# Patient Record
Sex: Male | Born: 1985 | Race: White | Hispanic: No | Marital: Married | Smoking: Current every day smoker
Health system: Southern US, Community
[De-identification: ages and names within clinical notes are randomized; demographics above are authoritative.]

## PROBLEM LIST (undated history)

## (undated) DIAGNOSIS — R51 Headache: Secondary | ICD-10-CM

## (undated) DIAGNOSIS — K219 Gastro-esophageal reflux disease without esophagitis: Secondary | ICD-10-CM

## (undated) DIAGNOSIS — K760 Fatty (change of) liver, not elsewhere classified: Secondary | ICD-10-CM

## (undated) DIAGNOSIS — R519 Headache, unspecified: Secondary | ICD-10-CM

## (undated) DIAGNOSIS — E669 Obesity, unspecified: Secondary | ICD-10-CM

## (undated) HISTORY — PX: WISDOM TOOTH EXTRACTION: SHX21

## (undated) HISTORY — DX: Headache: R51

## (undated) HISTORY — DX: Gastro-esophageal reflux disease without esophagitis: K21.9

## (undated) HISTORY — DX: Headache, unspecified: R51.9

## (undated) HISTORY — DX: Fatty (change of) liver, not elsewhere classified: K76.0

## (undated) HISTORY — DX: Obesity, unspecified: E66.9

---
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
## 2007-09-11 ENCOUNTER — Emergency Department (HOSPITAL_COMMUNITY): Admission: EM | Admit: 2007-09-11 | Discharge: 2007-09-11 | Payer: Self-pay | Admitting: Emergency Medicine

## 2010-04-29 ENCOUNTER — Emergency Department (HOSPITAL_COMMUNITY)
Admission: EM | Admit: 2010-04-29 | Discharge: 2010-04-29 | Payer: Self-pay | Source: Home / Self Care | Admitting: Family Medicine

## 2011-03-10 LAB — POCT I-STAT, CHEM 8
Chloride: 103
Creatinine, Ser: 1
HCT: 48
Hemoglobin: 16.3
Potassium: 4.2
Sodium: 138

## 2011-03-10 LAB — POCT CARDIAC MARKERS: Myoglobin, poc: 33.2

## 2013-06-05 ENCOUNTER — Encounter (HOSPITAL_COMMUNITY): Payer: Self-pay | Admitting: Emergency Medicine

## 2013-06-05 ENCOUNTER — Emergency Department (HOSPITAL_COMMUNITY)
Admission: EM | Admit: 2013-06-05 | Discharge: 2013-06-05 | Disposition: A | Discharge status: Home / Self Care | Payer: Self-pay | Attending: Emergency Medicine | Admitting: Emergency Medicine

## 2013-06-05 ENCOUNTER — Emergency Department (HOSPITAL_COMMUNITY): Payer: Self-pay

## 2013-06-05 DIAGNOSIS — R51 Headache: Secondary | ICD-10-CM | POA: Insufficient documentation

## 2013-06-05 DIAGNOSIS — R209 Unspecified disturbances of skin sensation: Secondary | ICD-10-CM | POA: Insufficient documentation

## 2013-06-05 DIAGNOSIS — Z87891 Personal history of nicotine dependence: Secondary | ICD-10-CM | POA: Insufficient documentation

## 2013-06-05 DIAGNOSIS — Z792 Long term (current) use of antibiotics: Secondary | ICD-10-CM | POA: Insufficient documentation

## 2013-06-05 DIAGNOSIS — H538 Other visual disturbances: Secondary | ICD-10-CM | POA: Insufficient documentation

## 2013-06-05 DIAGNOSIS — Z79899 Other long term (current) drug therapy: Secondary | ICD-10-CM | POA: Insufficient documentation

## 2013-06-05 DIAGNOSIS — R42 Dizziness and giddiness: Secondary | ICD-10-CM | POA: Insufficient documentation

## 2013-06-05 MED ORDER — FLUORESCEIN SODIUM 1 MG OP STRP: 1.0000 | ORAL_STRIP | Freq: Once | OPHTHALMIC | Status: DC

## 2013-06-05 NOTE — ED Notes (Signed)
Pa  at bedside. 

## 2013-06-05 NOTE — ED Notes (Signed)
Pt. reports left temporal headache with dizziness for several days , denies head injury , seen at an Urgent Care yesterday diagnosed with " tension headache" prescribed with muscle relaxant / pain medication and antibiotic for tooth infection .

## 2013-06-05 NOTE — ED Provider Notes (Signed)
CSN: [id number]     Arrival date & time 06/05/13  1937 History   First MD Initiated Contact with Patient 06/05/13 2008     Chief Complaint  Patient presents with  . Headache  . Dizziness   (Consider location/radiation/quality/duration/timing/severity/associated sxs/prior Treatment) HPI History provided by pt.   Pt presents w/ headache x 1 weeks.  Intermittent, mild, migratory.  Started as numbness/tingling sensation at L temple that eventually graduated into a headache.  Pain improves with rest but then increases w/ standing and exertion and it feels as though his blood vessels are going to bust.  The most recent pain has been located posterior to right ear.  Associated w/ dizziness, blurred vision and today, numbness in hands.  He is currently asymptomatic w/ exception of mild tenderness behind right ear.  Denies fever, inner ear pain, nasal congestion, rhinorrhea, sore throat, extremity weakness, ataxia, confusion.  No prior h/o headaches.  No recent trauma.  Was evaluated at an urgent care for same yesterday, diagnosed w/ tension headache and prescribed flexeril and toradol, which have not given him any relief.  History reviewed. No pertinent past medical history. History reviewed. No pertinent past surgical history. No family history on file. History  Substance Use Topics  . Smoking status: Former Games developer  . Smokeless tobacco: Not on file  . Alcohol Use: Yes    Review of Systems  All other systems reviewed and are negative.    Allergies  Shrimp  Home Medications   Current Outpatient Rx  Name  Route  Sig  Dispense  Refill  . amoxicillin (AMOXIL) 500 MG capsule   Oral   Take 500 mg by mouth 3 (three) times daily.         . cyclobenzaprine (FLEXERIL) 10 MG tablet   Oral   Take 10 mg by mouth 2 (two) times daily.         Marland Kitchen ketorolac (TORADOL) 10 MG tablet   Oral   Take 10 mg by mouth 3 (three) times daily.          BP 146/80  Pulse 95  Temp(Src) 98.5 F (36.9 C)  (Oral)  Resp 16  Ht 6\' 2"  (1.88 m)  Wt 280 lb (127.007 kg)  BMI 35.93 kg/m2  SpO2 97% Physical Exam  Nursing note and vitals reviewed. Constitutional: He is oriented to person, place, and time. He appears well-developed and well-nourished.  HENT:  Head: Normocephalic and atraumatic.  No tenderness of sinuses or temples.   Eyes:  Normal appearance  Neck: Normal range of motion. Neck supple. No rigidity. No Brudzinski's sign and no Kernig's sign noted.  Cardiovascular: Normal rate, regular rhythm and intact distal pulses.   Pulmonary/Chest: Effort normal and breath sounds normal.  Musculoskeletal: Normal range of motion.  Neurological: He is alert and oriented to person, place, and time. No sensory deficit. Coordination normal.  CN 3-12 intact.  No nystagmus.  5/5 and equal upper and lower extremity strength.  No past pointing.    Skin: Skin is warm and dry. No rash noted.  Psychiatric: He has a normal mood and affect. His behavior is normal.    ED Course  Procedures (including critical care time) Labs Review Labs Reviewed - No data to display Imaging Review No results found.  EKG Interpretation   None       MDM   1. Headache    Healthy 27yo M presents w/ non-traumatic headache x 1wk.  No prior h/o same.  This is patient's  second evaluation for same; was diagnosed w/ tension headache at an urgent care yesterday and prescribed flexeril/ketoralac, which have not given him any relief.  He is very concerned.  On exam, afebrile, non-toxic and comfortable appearing, no focal neuro deficits or meningeal signs.  Discussed CT head now to ease patient's anxiety vs. Watchful waiting, and he prefers to be imaged today.  I suspect that he will return if CT is not performed.  Results pending.  He declines pain medication.  9:34 PM   CT head negative.  Pt has been reassured.  Referred to neuro for persistent/worsening sx.  Return precautions discussed.    Otilio Miu,  PA-C 06/07/13 [phone]

## 2013-06-08 NOTE — ED Provider Notes (Signed)
Medical screening examination/treatment/procedure(s) were performed by non-physician practitioner and as supervising physician I was immediately available for consultation/collaboration.  EKG Interpretation   None         Dagmar Hait, MD 06/08/13 [phone]

## 2013-08-29 ENCOUNTER — Ambulatory Visit (INDEPENDENT_AMBULATORY_CARE_PROVIDER_SITE_OTHER): Payer: Self-pay | Admitting: Internal Medicine

## 2013-08-29 ENCOUNTER — Encounter: Payer: Self-pay | Admitting: Internal Medicine

## 2013-08-29 VITALS — BP 128/84 | HR 90 | Temp 98.4°F | Ht 73.5 in | Wt 291.0 lb

## 2013-08-29 DIAGNOSIS — Z0000 Encounter for general adult medical examination without abnormal findings: Secondary | ICD-10-CM

## 2013-08-29 DIAGNOSIS — R519 Headache, unspecified: Secondary | ICD-10-CM | POA: Insufficient documentation

## 2013-08-29 DIAGNOSIS — R51 Headache: Secondary | ICD-10-CM

## 2013-08-29 LAB — LIPID PANEL
CHOL/HDL RATIO: 5
CHOLESTEROL: 185 mg/dL (ref 0–200)
HDL: 39.7 mg/dL (ref >39.00)
LDL CALC: 124 mg/dL — AB (ref 0–99)
TRIGLYCERIDES: 108 mg/dL (ref 0.0–149.0)
VLDL: 21.6 mg/dL (ref 0.0–40.0)

## 2013-08-29 LAB — COMPREHENSIVE METABOLIC PANEL
ALK PHOS: 41 U/L (ref 39–117)
ALT: 37 U/L (ref 0–53)
AST: 29 U/L (ref 0–37)
Albumin: 5.1 g/dL (ref 3.5–5.2)
BILIRUBIN TOTAL: 0.7 mg/dL (ref 0.3–1.2)
BUN: 18 mg/dL (ref 6–23)
CALCIUM: 9.9 mg/dL (ref 8.4–10.5)
CHLORIDE: 102 meq/L (ref 96–112)
CO2: 31 mEq/L (ref 19–32)
CREATININE: 0.8 mg/dL (ref 0.4–1.5)
GFR: 129.44 mL/min (ref >60.00)
Glucose, Bld: 91 mg/dL (ref 70–99)
Potassium: 4.6 mEq/L (ref 3.5–5.1)
Sodium: 138 mEq/L (ref 135–145)
Total Protein: 8.1 g/dL (ref 6.0–8.3)

## 2013-08-29 LAB — CBC
HCT: 47.3 % (ref 39.0–52.0)
HEMOGLOBIN: 16 g/dL (ref 13.0–17.0)
MCHC: 33.9 g/dL (ref 30.0–36.0)
MCV: 83.5 fl (ref 78.0–100.0)
PLATELETS: 177 10*3/uL (ref 150.0–400.0)
RBC: 5.66 Mil/uL (ref 4.22–5.81)
RDW: 12.8 % (ref 11.5–14.6)
WBC: 6.2 10*3/uL (ref 4.5–10.5)

## 2013-08-29 LAB — TSH: TSH: 2.1 u[IU]/mL (ref 0.35–5.50)

## 2013-08-29 NOTE — Patient Instructions (Addendum)

## 2013-08-29 NOTE — Progress Notes (Signed)
HPI  Pt presents to the clinic today to establish care. He has not had a PCP in many years. He would go to UC if needed. He does have some concerns today about frequent headaches. He reports they occur almost every day. This started back in December. He describes them as pressure, sometimes it feels like a needle is sticking in his temples. The pain in his moves. He denies blurred vision, dizziness, chest tightness, or shortness of breath. He has had a normal head CT done at the ER. He has tried ibuprofen OTC with some relief. He denies any injury to the neck.  Flu: never Tetanus: > 10 years ago Dentist: 2015  Past Medical History  Diagnosis Date  . Frequent headaches     No current outpatient prescriptions on file.   No current facility-administered medications for this visit.    Allergies  Allergen Reactions  . Shrimp [Shellfish Allergy]     Family History  Problem Relation Age of Onset  . Hypertension Mother   . Thyroid disease Mother   . Heart disease Father   . Stomach cancer Maternal Grandmother   . Prostate cancer Maternal Grandfather     History   Social History  . Marital Status: Married    Spouse Name: N/A    Number of Children: N/A  . Years of Education: N/A   Occupational History  . Not on file.   Social History Main Topics  . Smoking status: Former Smoker    Types: Cigarettes  . Smokeless tobacco: Never Used  . Alcohol Use: Yes     Comment: occasional  . Drug Use: No  . Sexual Activity: Not on file   Other Topics Concern  . Not on file   Social History Narrative  . No narrative on file    ROS:  Constitutional: Pt reports headache. Denies fever, malaise, fatigue, or abrupt weight changes.  HEENT: Denies eye pain, eye redness, ear pain, ringing in the ears, wax buildup, runny nose, nasal congestion, bloody nose, or sore throat. Respiratory: Denies difficulty breathing, shortness of breath, cough or sputum production.   Cardiovascular: Denies  chest pain, chest tightness, palpitations or swelling in the hands or feet.  Gastrointestinal: Denies abdominal pain, bloating, constipation, diarrhea or blood in the stool.  GU: Denies frequency, urgency, pain with urination, blood in urine, odor or discharge. Musculoskeletal: Denies decrease in range of motion, difficulty with gait, muscle pain or joint pain and swelling.  Skin: Denies redness, rashes, lesions or ulcercations.  Neurological: Denies dizziness, difficulty with memory, difficulty with speech or problems with balance and coordination.   No other specific complaints in a complete review of systems (except as listed in HPI above).  PE:  BP 128/84  Pulse 90  Temp(Src) 98.4 F (36.9 C) (Oral)  Ht 6' 1.5" (1.867 m)  Wt 291 lb (131.997 kg)  BMI 37.87 kg/m2  SpO2 99% Wt Readings from Last 3 Encounters:  08/29/13 291 lb (131.997 kg)  06/05/13 280 lb (127.007 kg)    General: Appears his stated age, obese but well developed, well nourished in NAD. HEENT: Head: normal shape and size; Eyes: sclera white, no icterus, conjunctiva pink, PERRLA and EOMs intact; Ears: Tm's gray and intact, normal light reflex; Nose: mucosa pink and moist, septum midline; Throat/Mouth: Teeth present, mucosa pink and moist, no lesions or ulcerations noted.  Neck: Normal range of motion. Neck supple, trachea midline. No massses, lumps or thyromegaly present.  Cardiovascular: Normal rate and rhythm. S1,S2 noted.  No murmur, rubs or gallops noted. No JVD or BLE edema. No carotid bruits noted. Pulmonary/Chest: Normal effort and positive vesicular breath sounds. No respiratory distress. No wheezes, rales or ronchi noted.  Abdomen: Soft and nontender. Normal bowel sounds, no bruits noted. No distention or masses noted. Liver, spleen and kidneys non palpable. Musculoskeletal: Normal range of motion. No signs of joint swelling. No difficulty with gait.  Neurological: Alert and oriented. Cranial nerves II-XII  intact. Coordination normal. +DTRs bilaterally. Psychiatric: Mood and affect normal. Behavior is normal. Judgment and thought content normal.      Assessment and Plan:  Preventative Health Maintenance:  Encouraged him to work on diet and exercise Will obtain screening labs today

## 2013-08-29 NOTE — Assessment & Plan Note (Signed)
Was told they were tension headaches No improvement with muscle relaxers or ibuprofen Normal head CT Will check labs today May benefit from referral to headache wellness center

## 2013-08-29 NOTE — Progress Notes (Signed)
Pre visit review using our clinic review tool, if applicable. No additional management support is needed unless otherwise documented below in the visit note. 

## 2013-08-30 ENCOUNTER — Encounter: Payer: Self-pay | Admitting: Family Medicine

## 2013-09-06 ENCOUNTER — Ambulatory Visit (INDEPENDENT_AMBULATORY_CARE_PROVIDER_SITE_OTHER): Payer: Self-pay | Admitting: Internal Medicine

## 2013-09-06 ENCOUNTER — Ambulatory Visit (INDEPENDENT_AMBULATORY_CARE_PROVIDER_SITE_OTHER)
Admission: RE | Admit: 2013-09-06 | Discharge: 2013-09-06 | Disposition: A | Discharge status: Home / Self Care | Payer: Self-pay | Source: Ambulatory Visit | Attending: Internal Medicine | Admitting: Internal Medicine

## 2013-09-06 ENCOUNTER — Encounter: Payer: Self-pay | Admitting: Internal Medicine

## 2013-09-06 VITALS — BP 140/78 | HR 84 | Temp 98.1°F | Wt 300.0 lb

## 2013-09-06 DIAGNOSIS — R0789 Other chest pain: Secondary | ICD-10-CM

## 2013-09-06 DIAGNOSIS — R042 Hemoptysis: Secondary | ICD-10-CM

## 2013-09-06 NOTE — Progress Notes (Signed)
HPI  Pt presents to the clinic today with c/o burning in his chest and spitting up blood. He reports this started a few weeks ago. He used to only spit up blood in the morning, now it occurs throughout the day. He denies cough, chest congestion, fever, chills or body aches. He does not smoke or use tobacco. He does not spit up blood when he brushes his teeth.  Review of Systems      Past Medical History  Diagnosis Date  . Frequent headaches     Family History  Problem Relation Age of Onset  . Hypertension Mother   . Thyroid disease Mother   . Heart disease Father   . Stomach cancer Maternal Grandmother   . Prostate cancer Maternal Grandfather     History   Social History  . Marital Status: Married    Spouse Name: N/A    Number of Children: N/A  . Years of Education: N/A   Occupational History  . Not on file.   Social History Main Topics  . Smoking status: Former Smoker    Types: Cigarettes  . Smokeless tobacco: Never Used  . Alcohol Use: 0.6 oz/week    1 Cans of beer per week     Comment: occasional  . Drug Use: No  . Sexual Activity: Yes   Other Topics Concern  . Not on file   Social History Narrative  . No narrative on file    Allergies  Allergen Reactions  . Shrimp [Shellfish Allergy]      Constitutional:  Denies headache, fatigue, fever or abrupt weight changes.  HEENT:  Denies eye redness, eye pain, pressure behind the eyes, facial pain, nasal congestion, ear pain, ringing in the ears, wax buildup, runny nose or bloody nose. Respiratory: Positive cough. Denies difficulty breathing or shortness of breath.  Cardiovascular: Denies chest pain, chest tightness, palpitations or swelling in the hands or feet.   No other specific complaints in a complete review of systems (except as listed in HPI above).  Objective:   BP 140/78  Pulse 84  Temp(Src) 98.1 F (36.7 C) (Oral)  Wt 300 lb (136.079 kg)  SpO2 97% Wt Readings from Last 3 Encounters:   09/06/13 300 lb (136.079 kg)  08/29/13 291 lb (131.997 kg)  06/05/13 280 lb (127.007 kg)     General: Appears his stated age, well developed, well nourished in NAD. HEENT: Head: normal shape and size; Eyes: sclera white, no icterus, conjunctiva pink, PERRLA and EOMs intact; Ears: Tm's gray and intact, normal light reflex; Nose: mucosa pink and moist, septum midline; Throat/Mouth: + PND. Teeth present, mucosa pink and moist, no exudate noted, no lesions or ulcerations noted.  Neck: Neck supple, trachea midline. No massses, lumps or thyromegaly present.  Cardiovascular: Normal rate and rhythm. S1,S2 noted.  No murmur, rubs or gallops noted. No JVD or BLE edema. No carotid bruits noted. Pulmonary/Chest: Normal effort and positive vesicular breath sounds. No respiratory distress. No wheezes, rales or ronchi noted.      Assessment & Plan:   Spitting up blood and  Burning in chest:  I think its related to allergies- start zyrtec daily OTC Will obtain a chest xray today to r/o lung etiology  RTC as needed or if symptoms persist.

## 2013-09-06 NOTE — Patient Instructions (Addendum)
Hemoptysis  Hemoptysis, which means coughing up blood, can be a sign of a minor problem or a serious medical condition. The blood that is coughed up may come from the lungs and airways. Coughed-up blood can also come from bleeding that occurs outside the lungs and airways. Blood can drain into the windpipe during a severe nosebleed or when blood is vomited from the stomach. Because hemoptysis can be a sign of something serious, a medical evaluation is required. For some people with hemoptysis, no definite cause is ever identified.  CAUSES   The most common cause of hemoptysis is bronchitis. Some other common causes include:   · A ruptured blood vessel caused by coughing or an infection.    · A medical condition that causes damage to the large air passageways (bronchiectasis).    · A blood clot in the lungs (pulmonary embolism).    · Pneumonia.    · Tuberculosis.    · Breathing in a small foreign object.    · Cancer.  For some people with hemoptysis, no definite cause is ever identified.    HOME CARE INSTRUCTIONS  · Only take over-the-counter or prescription medicines as directed by your caregiver. Do not use cough suppressants unless your caregiver approves.  · If your caregiver prescribes antibiotic medicines, take them as directed. Finish them even if you start to feel better.  · Do not smoke. Also avoid secondhand smoke.  · Follow up with your caregiver as directed.  SEEK IMMEDIATE MEDICAL CARE IF:   · You cough up bloody mucus for longer than a week.  · You have a blood-producing cough that is severe or getting worse.  · You have a blood-producing cough that comes and goes over time.  · You develop problems with your breathing.    · You vomit blood.  · You develop bloody or black-colored stools.  · You have chest pain.    · You develop night sweats.  · You feel faint or pass out.    · You have a fever or persistent symptoms for more than 2 3 days.    · You have a fever and your symptoms suddenly get worse.  MAKE  SURE YOU:  · Understand these instructions.  · Will watch your condition.  · Will get help right away if you are not doing well or get worse.  Document Released: 08/11/2001 Document Revised: 05/19/2012 Document Reviewed: 03/19/2012  ExitCare® Patient Information ©2014 [employer].

## 2013-09-06 NOTE — Progress Notes (Signed)
Pre visit review using our clinic review tool, if applicable. No additional management support is needed unless otherwise documented below in the visit note. 

## 2013-10-11 ENCOUNTER — Encounter (HOSPITAL_COMMUNITY): Payer: Self-pay | Admitting: Emergency Medicine

## 2013-10-11 DIAGNOSIS — R209 Unspecified disturbances of skin sensation: Secondary | ICD-10-CM | POA: Insufficient documentation

## 2013-10-11 DIAGNOSIS — M5412 Radiculopathy, cervical region: Secondary | ICD-10-CM | POA: Insufficient documentation

## 2013-10-11 DIAGNOSIS — Z87891 Personal history of nicotine dependence: Secondary | ICD-10-CM | POA: Insufficient documentation

## 2013-10-11 DIAGNOSIS — Z79899 Other long term (current) drug therapy: Secondary | ICD-10-CM | POA: Insufficient documentation

## 2013-10-11 NOTE — ED Notes (Signed)
Pt. reports pain at back of neck radiating to back of head for 1 month , pain worse with movement and stiffness in the morning , pt. also reported tingling/numbness at both arms , equal strong grips , no arm drift , speech clear/no facial asymmetry. Ambulatory . Pt. denies fever or chills.

## 2013-10-12 ENCOUNTER — Emergency Department (HOSPITAL_COMMUNITY)
Admission: EM | Admit: 2013-10-12 | Discharge: 2013-10-12 | Disposition: A | Discharge status: Home / Self Care | Payer: Medicaid Other | Attending: Emergency Medicine | Admitting: Emergency Medicine

## 2013-10-12 DIAGNOSIS — M542 Cervicalgia: Secondary | ICD-10-CM

## 2013-10-12 DIAGNOSIS — M5412 Radiculopathy, cervical region: Secondary | ICD-10-CM

## 2013-10-12 LAB — COMPREHENSIVE METABOLIC PANEL
ALBUMIN: 4.3 g/dL (ref 3.5–5.2)
ALK PHOS: 56 U/L (ref 39–117)
ALT: 28 U/L (ref 0–53)
AST: 22 U/L (ref 0–37)
BUN: 13 mg/dL (ref 6–23)
CHLORIDE: 99 meq/L (ref 96–112)
CO2: 25 mEq/L (ref 19–32)
Calcium: 9.9 mg/dL (ref 8.4–10.5)
Creatinine, Ser: 0.68 mg/dL (ref 0.50–1.35)
GFR calc Af Amer: >90 mL/min (ref >90)
GFR calc non Af Amer: >90 mL/min (ref >90)
Glucose, Bld: 88 mg/dL (ref 70–99)
POTASSIUM: 4.2 meq/L (ref 3.7–5.3)
Sodium: 139 mEq/L (ref 137–147)
TOTAL PROTEIN: 7.6 g/dL (ref 6.0–8.3)
Total Bilirubin: 0.2 mg/dL — ABNORMAL LOW (ref 0.3–1.2)

## 2013-10-12 LAB — CBC WITH DIFFERENTIAL/PLATELET
BASOS ABS: 0 10*3/uL (ref 0.0–0.1)
BASOS PCT: 0 % (ref 0–1)
EOS ABS: 0.2 10*3/uL (ref 0.0–0.7)
Eosinophils Relative: 2 % (ref 0–5)
HCT: 46.9 % (ref 39.0–52.0)
HEMOGLOBIN: 16.8 g/dL (ref 13.0–17.0)
Lymphocytes Relative: 34 % (ref 12–46)
Lymphs Abs: 3.5 10*3/uL (ref 0.7–4.0)
MCH: 29 pg (ref 26.0–34.0)
MCHC: 35.8 g/dL (ref 30.0–36.0)
MCV: 81 fL (ref 78.0–100.0)
MONOS PCT: 8 % (ref 3–12)
Monocytes Absolute: 0.9 10*3/uL (ref 0.1–1.0)
NEUTROS ABS: 5.9 10*3/uL (ref 1.7–7.7)
NEUTROS PCT: 56 % (ref 43–77)
PLATELETS: 171 10*3/uL (ref 150–400)
RBC: 5.79 MIL/uL (ref 4.22–5.81)
RDW: 12.5 % (ref 11.5–15.5)
WBC: 10.4 10*3/uL (ref 4.0–10.5)

## 2013-10-12 MED ORDER — NAPROXEN 500 MG PO TABS: 500.0000 mg | ORAL_TABLET | Freq: Two times a day (BID) | ORAL | Status: DC

## 2013-10-12 MED ORDER — IBUPROFEN 800 MG PO TABS
800.0000 mg | ORAL_TABLET | Freq: Once | ORAL | Status: AC
  Administered 2013-10-12: 800 mg via ORAL
  Filled 2013-10-12: qty 1

## 2013-10-12 MED ORDER — PREDNISONE 20 MG PO TABS: ORAL_TABLET | ORAL | Status: DC

## 2013-10-12 NOTE — Discharge Instructions (Signed)
Take naproxen and for pain. Use ice and heat as needed. Take steroids as discussed. Followup closely with her primary doctor to arrange an MRI of your neck. Return to the ER for weakness, worsening numbness, bowel or bladder changes, fever or other concerns.  Cervical Radiculopathy Cervical radiculopathy happens when a nerve in the neck is pinched or bruised by a slipped (herniated) disk or by arthritic changes in the bones of the cervical spine. This can occur due to an injury or as part of the normal aging process. Pressure on the cervical nerves can cause pain or numbness that runs from your neck all the way down into your arm and fingers. CAUSES  There are many possible causes, including:  Injury.  Muscle tightness in the neck from overuse.  Swollen, painful joints (arthritis).  Breakdown or degeneration in the bones and joints of the spine (spondylosis) due to aging.  Bone spurs that may develop near the cervical nerves. SYMPTOMS  Symptoms include pain, weakness, or numbness in the affected arm and hand. Pain can be severe or irritating. Symptoms may be worse when extending or turning the neck. DIAGNOSIS  Your caregiver will ask about your symptoms and do a physical exam. He or she may test your strength and reflexes. X-rays, CT scans, and MRI scans may be needed in cases of injury or if the symptoms do not go away after a period of time. Electromyography (EMG) or nerve conduction testing may be done to study how your nerves and muscles are working. TREATMENT  Your caregiver may recommend certain exercises to help relieve your symptoms. Cervical radiculopathy can, and often does, get better with time and treatment. If your problems continue, treatment options may include:  Wearing a soft collar for short periods of time.  Physical therapy to strengthen the neck muscles.  Medicines, such as nonsteroidal anti-inflammatory drugs (NSAIDs), oral corticosteroids, or spinal  injections.  Surgery. Different types of surgery may be done depending on the cause of your problems. HOME CARE INSTRUCTIONS   Put ice on the affected area.  Put ice in a plastic bag.  Place a towel between your skin and the bag.  Leave the ice on for 15-20 minutes, 03-04 times a day or as directed by your caregiver.  If ice does not help, you can try using heat. Take a warm shower or bath, or use a hot water bottle as directed by your caregiver.  You may try a gentle neck and shoulder massage.  Use a flat pillow when you sleep.  Only take over-the-counter or prescription medicines for pain, discomfort, or fever as directed by your caregiver.  If physical therapy was prescribed, follow your caregiver's directions.  If a soft collar was prescribed, use it as directed. SEEK IMMEDIATE MEDICAL CARE IF:   Your pain gets much worse and cannot be controlled with medicines.  You have weakness or numbness in your hand, arm, face, or leg.  You have a high fever or a stiff, rigid neck.  You lose bowel or bladder control (incontinence).  You have trouble with walking, balance, or speaking. MAKE SURE YOU:   Understand these instructions.  Will watch your condition.  Will get help right away if you are not doing well or get worse. Document Released: 02/25/2001 Document Revised: 08/25/2011 Document Reviewed: 01/14/2011 [facility] Patient Information 2014 [place] ParkExitCare, [place].

## 2013-10-12 NOTE — ED Provider Notes (Signed)
CSN: [id number]     Arrival date & time 10/11/13  2241 History   First MD Initiated Contact with Patient 10/12/13 [phone]     Chief Complaint  Patient presents with  . Neck Pain     (Consider location/radiation/quality/duration/timing/severity/associated sxs/prior Treatment) HPI Comments: 28 year old male with no significant medical history. Past smoker. He presents with worsening neck pain for the past month, worse with position, movement. Patient developed numbness in both arms and tingling the past 24 hours. No weakness, bowel or bladder changes, fevers or stiffness. Patient has mild pain with movement of neck. No history of neck surgery or known bulging discs. Patient does work out with Weyerhaeuser Companyweights regularly. No IV drug use history. No injuries.  Patient is a 28 y.o. male presenting with neck pain. The history is provided by the patient.  Neck Pain Associated symptoms: numbness   Associated symptoms: no fever, no headaches and no weakness     Past Medical History  Diagnosis Date  . Frequent headaches    History reviewed. No pertinent past surgical history. Family History  Problem Relation Age of Onset  . Hypertension Mother   . Thyroid disease Mother   . Heart disease Father   . Stomach cancer Maternal Grandmother   . Prostate cancer Maternal Grandfather    History  Substance Use Topics  . Smoking status: Former Smoker    Types: Cigarettes  . Smokeless tobacco: Never Used  . Alcohol Use: 0.6 oz/week    1 Cans of beer per week     Comment: occasional    Review of Systems  Constitutional: Negative for fever and chills.  Eyes: Negative for visual disturbance.  Gastrointestinal: Negative for vomiting and abdominal pain.  Genitourinary: Negative for difficulty urinating.  Musculoskeletal: Positive for neck pain. Negative for back pain and neck stiffness.  Skin: Negative for rash.  Neurological: Positive for numbness. Negative for weakness, light-headedness and headaches.       Allergies  Shrimp  Home Medications   Prior to Admission medications   Medication Sig Start Date End Date Taking? Authorizing Provider  cetirizine (ZYRTEC) 10 MG tablet Take 10 mg by mouth daily.   Yes Historical Provider, MD  esomeprazole (NEXIUM) 40 MG capsule Take 40 mg by mouth daily at 12 noon.   Yes Historical Provider, MD  naproxen (NAPROSYN) 500 MG tablet Take 1 tablet (500 mg total) by mouth 2 (two) times daily. 10/12/13   Enid SkeensJoshua M Aniaya Bacha, MD  predniSONE (DELTASONE) 20 MG tablet 3 tabs po day one, then 2 tabs daily x 4 days 10/12/13   Enid SkeensJoshua M Halleigh Comes, MD   BP 136/77  Pulse 90  Temp(Src) 98.4 F (36.9 C) (Oral)  Resp 18  Ht 6\' 3"  (1.905 m)  Wt 290 lb (131.543 kg)  BMI 36.25 kg/m2  SpO2 95% Physical Exam  Nursing note and vitals reviewed. Constitutional: He is oriented to person, place, and time. He appears well-developed and well-nourished.  HENT:  Head: Normocephalic and atraumatic.  Eyes: Conjunctivae are normal. Right eye exhibits no discharge. Left eye exhibits no discharge.  Neck: Normal range of motion. Neck supple. No tracheal deviation present.  Cardiovascular: Normal rate and regular rhythm.   Pulmonary/Chest: Effort normal.  Musculoskeletal: He exhibits no edema.  Neurological: He is alert and oriented to person, place, and time.  Reflex Scores:      Tricep reflexes are 1+ on the right side and 1+ on the left side.      Bicep reflexes are 1+ on  the right side and 1+ on the left side. 5+ strength in UE and LE with f/e at major joints. Sensation to palpation intact in UE and LE. CNs 2-12 grossly intact.  EOMFI.  PERRL.   Finger nose and coordination intact bilateral.   Visual fields intact to finger testing.   Skin: Skin is warm. No rash noted.  Psychiatric: He has a normal mood and affect.    ED Course  Procedures (including critical care time) Labs Review Labs Reviewed  COMPREHENSIVE METABOLIC PANEL - Abnormal; Notable for the following:     Total Bilirubin 0.2 (*)    All other components within normal limits  CBC WITH DIFFERENTIAL    Imaging Review No results found.   EKG Interpretation None      MDM   Final diagnoses:  Cervical radiculopathy  Neck pain   Well-appearing healthy male with radicular symptoms. Normal neuro exam in ED. No indication for emergent MRI. Discussed NSAIDs, steroids and followup with primary care provider to arrange MRI. Strict reasons to return including weakness, bowel or bladder changes, fever, other.  Results and differential diagnosis were discussed with the patient. Close follow up outpatient was discussed, patient comfortable with the plan.   Filed Vitals:   10/11/13 2253 10/12/13 0143 10/12/13 0243  BP: 153/86 124/70 136/77  Pulse: 99 93 90  Temp: 99 F (37.2 C) 98.4 F (36.9 C)   TempSrc: Oral Oral   Resp: 18 20 18   Height: 6\' 3"  (1.905 m)    Weight: 290 lb (131.543 kg)    SpO2: 98% 97% 95%      Enid SkeensJoshua M Matthew Cina, MD 10/12/13 [phone]

## 2013-10-12 NOTE — ED Notes (Signed)
Pt A&OX4, ambulatory at discharge with slow, steady gait, verbalizing no complaints at this time.  

## 2013-10-13 ENCOUNTER — Ambulatory Visit (INDEPENDENT_AMBULATORY_CARE_PROVIDER_SITE_OTHER)
Admission: RE | Admit: 2013-10-13 | Discharge: 2013-10-13 | Disposition: A | Discharge status: Home / Self Care | Payer: Medicaid Other | Source: Ambulatory Visit | Attending: Internal Medicine | Admitting: Internal Medicine

## 2013-10-13 ENCOUNTER — Encounter: Payer: Self-pay | Admitting: Internal Medicine

## 2013-10-13 ENCOUNTER — Ambulatory Visit (INDEPENDENT_AMBULATORY_CARE_PROVIDER_SITE_OTHER): Payer: Medicaid Other | Admitting: Internal Medicine

## 2013-10-13 VITALS — BP 142/82 | HR 107 | Temp 98.2°F | Wt 295.2 lb

## 2013-10-13 DIAGNOSIS — M5412 Radiculopathy, cervical region: Secondary | ICD-10-CM

## 2013-10-13 NOTE — Progress Notes (Signed)
Subjective:    Patient ID: Alexander GrayJohn W Navarro, male    DOB: 07/01/1985, [age] y.o.   MRN: [id number]  HPI  Pt presents to the clinic today to f/u ER for neck pain. He presents to the ER yesterday with worsening neck pain x 1 month. It seemed to be worse with movement. The pain radiated into his arms with some numbness. Pt states that the numbness is worse in the right hand with mild tingling in left hand. He denies any specific injuries but does lift and squat 300+ lbs of weights but has stopped this now. No imaging was done in the ER. They put him on prednisone and naproxen. He was told to follow up with PCP to discuss obtaining MRI. Today, He does report improvement in pain from a 6/10 to 1/10 with the prednisone and naproxen.  Review of Systems  Past Medical History  Diagnosis Date  . Frequent headaches     Current Outpatient Prescriptions  Medication Sig Dispense Refill  . cetirizine (ZYRTEC) 10 MG tablet Take 10 mg by mouth daily.      Marland Kitchen. esomeprazole (NEXIUM) 40 MG capsule Take 40 mg by mouth daily at 12 noon.      . naproxen (NAPROSYN) 500 MG tablet Take 1 tablet (500 mg total) by mouth 2 (two) times daily.  20 tablet  0  . predniSONE (DELTASONE) 20 MG tablet 3 tabs po day one, then 2 tabs daily x 4 days  11 tablet  0   No current facility-administered medications for this visit.   Allergies  Allergen Reactions  . Shrimp [Shellfish Allergy] Nausea And Vomiting   Family History  Problem Relation Age of Onset  . Hypertension Mother   . Thyroid disease Mother   . Heart disease Father   . Stomach cancer Maternal Grandmother   . Prostate cancer Maternal Grandfather    History   Social History  . Marital Status: Married    Spouse Name: N/A    Number of Children: N/A  . Years of Education: N/A   Occupational History  . Not on file.   Social History Main Topics  . Smoking status: Former Smoker    Types: Cigarettes  . Smokeless tobacco: Never Used  . Alcohol Use: 0.6 oz/week     1 Cans of beer per week     Comment: occasional  . Drug Use: No  . Sexual Activity: Yes   Other Topics Concern  . Not on file   Social History Narrative  . No narrative on file   Constitutional: Denies fever, malaise, fatigue, headache or abrupt weight changes.  Musculoskeletal: Pt reports cervical neck pain that radiates to b/l  shoulders and b/l arms and hands. Denies difficulty with gait, muscle pain or joint pain and swelling.  Neurological: Pt reports pain radiating into his arms and shoulders. Denies: Dizziness, difficulty with memory, difficulty with speech or problems with balance and coordination.   No other specific complaints in a complete review of systems (except as listed in HPI above).  Objective:   Physical Exam  BP 142/82  Pulse 107  Temp(Src) 98.2 F (36.8 C) (Oral)  Wt 295 lb 4 oz (133.925 kg)  SpO2 98% Wt Readings from Last 3 Encounters:  10/13/13 295 lb 4 oz (133.925 kg)  10/11/13 290 lb (131.543 kg)  09/06/13 300 lb (136.079 kg)    General: Appears his stated age, well developed, well nourished in NAD. Neck: No pain to palpation of cervical spine. Normal  range of motion but pain with lateral flexion. Full strength of neck and shoulders. Normal ROM and strength of b/l arms. Normal gait.  Cardiovascular: Normal rate and rhythm. S1,S2 noted.  No murmur, rubs or gallops noted. No JVD or BLE edema. No carotid bruits noted. Pulmonary/Chest: Normal effort and positive vesicular breath sounds. No respiratory distress. No wheezes, rales or ronchi noted.   BMET    Component Value Date/Time   NA 139 10/12/2013 0114   K 4.2 10/12/2013 0114   CL 99 10/12/2013 0114   CO2 25 10/12/2013 0114   GLUCOSE 88 10/12/2013 0114   BUN 13 10/12/2013 0114   CREATININE 0.68 10/12/2013 0114   CALCIUM 9.9 10/12/2013 0114   GFRNONAA >90 10/12/2013 0114   GFRAA >90 10/12/2013 0114    Lipid Panel     Component Value Date/Time   CHOL 185 08/29/2013 1104   TRIG 108.0 08/29/2013  1104   HDL 39.70 08/29/2013 1104   CHOLHDL 5 08/29/2013 1104   VLDL 21.6 08/29/2013 1104   LDLCALC 124* 08/29/2013 1104    CBC    Component Value Date/Time   WBC 10.4 10/12/2013 0114   RBC 5.79 10/12/2013 0114   HGB 16.8 10/12/2013 0114   HCT 46.9 10/12/2013 0114   PLT 171 10/12/2013 0114   MCV 81.0 10/12/2013 0114   MCH 29.0 10/12/2013 0114   MCHC 35.8 10/12/2013 0114   RDW 12.5 10/12/2013 0114   LYMPHSABS 3.5 10/12/2013 0114   MONOABS 0.9 10/12/2013 0114   EOSABS 0.2 10/12/2013 0114   BASOSABS 0.0 10/12/2013 0114    Hgb A1C No results found for this basename: HGBA1C      Assessment & Plan:   Cervical radiculitis:  Hospital notes reviewed- time to review approx 10 minutes improving with treatment No indication for MRI at this time Neck exercises given Xray of cervical spine today  RTC as needed or if symptoms persist or worsen.

## 2013-10-13 NOTE — Patient Instructions (Addendum)
Cervical Radiculopathy  Cervical radiculopathy happens when a nerve in the neck is pinched or bruised by a slipped (herniated) disk or by arthritic changes in the bones of the cervical spine. This can occur due to an injury or as part of the normal aging process. Pressure on the cervical nerves can cause pain or numbness that runs from your neck all the way down into your arm and fingers.  CAUSES   There are many possible causes, including:  · Injury.  · Muscle tightness in the neck from overuse.  · Swollen, painful joints (arthritis).  · Breakdown or degeneration in the bones and joints of the spine (spondylosis) due to aging.  · Bone spurs that may develop near the cervical nerves.  SYMPTOMS   Symptoms include pain, weakness, or numbness in the affected arm and hand. Pain can be severe or irritating. Symptoms may be worse when extending or turning the neck.  DIAGNOSIS   Your caregiver will ask about your symptoms and do a physical exam. He or she may test your strength and reflexes. X-rays, CT scans, and MRI scans may be needed in cases of injury or if the symptoms do not go away after a period of time. Electromyography (EMG) or nerve conduction testing may be done to study how your nerves and muscles are working.  TREATMENT   Your caregiver may recommend certain exercises to help relieve your symptoms. Cervical radiculopathy can, and often does, get better with time and treatment. If your problems continue, treatment options may include:  · Wearing a soft collar for short periods of time.  · Physical therapy to strengthen the neck muscles.  · Medicines, such as nonsteroidal anti-inflammatory drugs (NSAIDs), oral corticosteroids, or spinal injections.  · Surgery. Different types of surgery may be done depending on the cause of your problems.  HOME CARE INSTRUCTIONS   · Put ice on the affected area.  · Put ice in a plastic bag.  · Place a towel between your skin and the bag.  · Leave the ice on for 15-20 minutes,  03-04 times a day or as directed by your caregiver.  · If ice does not help, you can try using heat. Take a warm shower or bath, or use a hot water bottle as directed by your caregiver.  · You may try a gentle neck and shoulder massage.  · Use a flat pillow when you sleep.  · Only take over-the-counter or prescription medicines for pain, discomfort, or fever as directed by your caregiver.  · If physical therapy was prescribed, follow your caregiver's directions.  · If a soft collar was prescribed, use it as directed.  SEEK IMMEDIATE MEDICAL CARE IF:   · Your pain gets much worse and cannot be controlled with medicines.  · You have weakness or numbness in your hand, arm, face, or leg.  · You have a high fever or a stiff, rigid neck.  · You lose bowel or bladder control (incontinence).  · You have trouble with walking, balance, or speaking.  MAKE SURE YOU:   · Understand these instructions.  · Will watch your condition.  · Will get help right away if you are not doing well or get worse.  Document Released: 02/25/2001 Document Revised: 08/25/2011 Document Reviewed: 01/14/2011  Exit[employer]® Patient Information ©2014 [employer].

## 2013-10-13 NOTE — Progress Notes (Signed)
Pre visit review using our clinic review tool, if applicable. No additional management support is needed unless otherwise documented below in the visit note. 

## 2013-10-18 ENCOUNTER — Telehealth: Payer: Self-pay | Admitting: Internal Medicine

## 2013-10-18 NOTE — Telephone Encounter (Signed)
Pt called, was seen last week for neck problems, would like to have an MRI scheduled.  Please advise.  [phone].  [place] [employer]

## 2013-10-18 NOTE — Telephone Encounter (Signed)
Have his symptoms gotten worse? Because when I saw him last week, there was no indication to have the MRI done.

## 2013-10-19 NOTE — Telephone Encounter (Signed)
Ok will order the MRI at that time if needed

## 2013-10-19 NOTE — Telephone Encounter (Signed)
Pt states he has gotten worse and he is experiencing more numbing--pt has made a f/u appt with you tomorrow

## 2013-10-20 ENCOUNTER — Ambulatory Visit (INDEPENDENT_AMBULATORY_CARE_PROVIDER_SITE_OTHER): Payer: Medicaid Other | Admitting: Internal Medicine

## 2013-10-20 ENCOUNTER — Encounter (HOSPITAL_COMMUNITY): Payer: Self-pay | Admitting: Emergency Medicine

## 2013-10-20 ENCOUNTER — Emergency Department (HOSPITAL_COMMUNITY)
Admission: EM | Admit: 2013-10-20 | Discharge: 2013-10-20 | Disposition: A | Discharge status: Home / Self Care | Payer: Medicaid Other | Attending: Emergency Medicine | Admitting: Emergency Medicine

## 2013-10-20 ENCOUNTER — Emergency Department (HOSPITAL_COMMUNITY): Payer: Medicaid Other

## 2013-10-20 ENCOUNTER — Encounter: Payer: Self-pay | Admitting: Internal Medicine

## 2013-10-20 VITALS — BP 128/76 | HR 90 | Temp 98.3°F | Wt 296.0 lb

## 2013-10-20 DIAGNOSIS — R209 Unspecified disturbances of skin sensation: Secondary | ICD-10-CM | POA: Insufficient documentation

## 2013-10-20 DIAGNOSIS — Z8739 Personal history of other diseases of the musculoskeletal system and connective tissue: Secondary | ICD-10-CM | POA: Insufficient documentation

## 2013-10-20 DIAGNOSIS — Z79899 Other long term (current) drug therapy: Secondary | ICD-10-CM | POA: Insufficient documentation

## 2013-10-20 DIAGNOSIS — M5412 Radiculopathy, cervical region: Secondary | ICD-10-CM | POA: Insufficient documentation

## 2013-10-20 DIAGNOSIS — R200 Anesthesia of skin: Secondary | ICD-10-CM

## 2013-10-20 DIAGNOSIS — Z791 Long term (current) use of non-steroidal anti-inflammatories (NSAID): Secondary | ICD-10-CM | POA: Insufficient documentation

## 2013-10-20 DIAGNOSIS — Z87891 Personal history of nicotine dependence: Secondary | ICD-10-CM | POA: Insufficient documentation

## 2013-10-20 MED ORDER — PREDNISONE 10 MG PO TABS: 50.0000 mg | ORAL_TABLET | Freq: Every day | ORAL | Status: DC

## 2013-10-20 MED ORDER — PREDNISONE 10 MG PO TABS: 50.0000 mg | ORAL_TABLET | Freq: Every day | ORAL | Status: AC

## 2013-10-20 NOTE — Assessment & Plan Note (Signed)
Worse since stopping prednisone Xray cervical spine normal Will obtain MRI cervical spine May need PT- will discuss after results are back

## 2013-10-20 NOTE — ED Provider Notes (Signed)
CSN: [id number]     Arrival date & time 10/20/13  1805 History   First MD Initiated Contact with Patient 10/20/13 1944     Chief Complaint  Patient presents with  . Numbness     (Consider location/radiation/quality/duration/timing/severity/associated sxs/prior Treatment) HPI Comments: Patient is 28 yo with prior HAs who presents to the ED with complaints of right sided arm numbness.  Patient reports that appx 10 days ago he began having right upper extremity numbness.  He was seen in our ED on 4/29 for these complaints and he was felt to have cervical radiculopathy, sent home on prednisone taper/naproxen, and instructed to follow up with PCP.  Patient followed up with PCP today and MRI is scheduled for 5/13.  Patient presented to ED this evening because his numbness in RUE has gotten worse and now includes forehead and right lower extremity.  He denies slurred speech, weakness, HA, back pain, bowel/bladder incontinence.  Patient reports that [name] but it has resolved after prednisone.    Patient is a [age] y.o. male presenting with general illness. The history is provided by the patient, the spouse and medical records. No language interpreter was used.  Illness Severity:  Moderate Onset quality:  Gradual Timing:  Intermittent Progression:  Worsening Chronicity:  New Associated symptoms: no abdominal pain, no chest pain, no cough, no headaches, no loss of consciousness, no shortness of breath and no sore throat     Past Medical History  Diagnosis Date  . Frequent headaches    History reviewed. No pertinent past surgical history. Family History  Problem Relation Age of Onset  . Hypertension Mother   . Thyroid disease Mother   . Heart disease Father   . Stomach cancer Maternal Grandmother   . Prostate cancer Maternal Grandfather    History  Substance Use Topics  . Smoking status: Former Smoker    Types: Cigarettes  . Smokeless tobacco: Never  Used  . Alcohol Use: 0.6 oz/week    1 Cans of beer per week     Comment: occasional    Review of Systems  HENT: Negative for sore throat.   Respiratory: Negative for cough and shortness of breath.   Cardiovascular: Negative for chest pain.  Gastrointestinal: Negative for abdominal pain.  Neurological: Negative for loss of consciousness and headaches.  All other systems reviewed and are negative.     Allergies  Shrimp  Home Medications   Prior to Admission medications   Medication Sig Start Date End Date Taking? Authorizing Provider  cetirizine (ZYRTEC) 10 MG tablet Take 10 mg by mouth daily.   Yes Historical Provider, MD  esomeprazole (NEXIUM) 40 MG capsule Take 40 mg by mouth daily at 12 noon.   Yes Historical Provider, MD  naproxen (NAPROSYN) 500 MG tablet Take 1 tablet (500 mg total) by mouth 2 (two) times daily. 10/12/13  Yes Alexander SkeensJoshua M Zavitz, MD   BP 162/49  Pulse 102  Temp(Src) 98.5 F (36.9 C) (Oral)  Resp 20  SpO2 100% Physical Exam  Nursing note and vitals reviewed. Constitutional: He is oriented to person, place, and time. He appears well-developed and well-nourished.  HENT:  Head: Normocephalic and atraumatic.  Right Ear: External ear normal.  Left Ear: External ear normal.  Mouth/Throat: Oropharynx is clear and moist.  Eyes: Pupils are equal, round, and reactive to light.  Neck: Normal range of motion. Neck supple. No JVD present. No tracheal deviation present. No thyromegaly present.  Mild pain with A/P ROM of neck.    Cardiovascular: Normal rate, regular rhythm and normal heart sounds.   Pulmonary/Chest: Effort normal and breath sounds normal.  Abdominal: Soft. Bowel sounds are normal.  Musculoskeletal: Normal range of motion. He exhibits no edema and no tenderness.  Lymphadenopathy:    He has no cervical adenopathy.  Neurological: He is alert and oriented to person, place, and time. He has normal reflexes. He displays normal reflexes. A sensory deficit  (patient reports decreased sensation to light touch over forehead, right lateral shoulder, and entire RLE.  ) is present. No cranial nerve deficit. He exhibits normal muscle tone. Coordination normal. GCS eye subscore is 4. GCS verbal subscore is 5. GCS motor subscore is 6.    ED Course  Procedures (including critical care time) Labs Review Labs Reviewed - No data to display  Imaging Review Mr Brain Wo Contrast  10/20/2013   CLINICAL DATA:  Worsening neck pain. New onset tremor and right upper extremity.  EXAM: MRI HEAD WITHOUT CONTRAST  TECHNIQUE: Multiplanar, multiecho pulse sequences of the brain and surrounding structures were obtained without intravenous contrast.  COMPARISON:  Prior CT from 06/05/2013  FINDINGS: The CSF containing spaces are within normal limits for patient age. No focal parenchymal signal abnormality is identified. No mass lesion, midline shift, or extra-axial fluid collection. Ventricles are normal in size without evidence of hydrocephalus.  No diffusion-weighted signal abnormality is identified to suggest acute intracranial infarct. Gray-white matter differentiation is maintained. Normal flow voids are seen within the intracranial vasculature. No intracranial hemorrhage identified.  The cervicomedullary junction is normal. Pituitary gland is within normal limits. Pituitary stalk is midline. The globes and optic nerves demonstrate a normal appearance with normal signal intensity.  The bone marrow signal intensity is normal. Calvarium is intact. Visualized upper cervical spine is within normal limits.  Scalp soft tissues are unremarkable.  Paranasal sinuses are clear.  No mastoid effusion.  IMPRESSION: Normal MRI of the brain with no acute intracranial abnormality identified.   Electronically Signed   By: Rise MuBenjamin  Navarro M.D.   On: 10/20/2013 22:24     EKG Interpretation None      MDM   Final diagnoses:  Numbness    Patient is a 28 y.o. male who presents with  complaints of right sided body numbness.  VS upon arrival notable for no fever, no tachycardia, and no severe HTN.  PE as above and remarkable for areas of decreased sensation to light touch over various nerve distributions of the right side of body.  Due to numbness now involving the entire right side of body it was felt that MRI of the brain w/o contrast was warranted to rule out CVA.  Review of results shows no evidence of acute abnormality as there is little concern for other emergent pathology.  PE remained unchanged.  With MRI being negative it was not felt that additional workup or intervention was needed.  Will have patient follow up with PCP as he may additional metabolic/infectious workup and encouraged him to get MRI of cervical spine as outpatient.  Will also prescribe additional steroids as patient reported that after initial 3 day burst his symptoms improved.        Alexander Ouerek Jakeb Lamping, MD 10/20/13 2329

## 2013-10-20 NOTE — Progress Notes (Signed)
Pre visit review using our clinic review tool, if applicable. No additional management support is needed unless otherwise documented below in the visit note. 

## 2013-10-20 NOTE — ED Notes (Signed)
Pt in c/o worsening right arm numbness and new right leg numbness and tingling over the last few days- pt was seen last Tuesday for neck pain and right arm numbness and dx with a pinched nerve, started and completed steroids which resolved his neck pain, but over the last few days the numbness and tingling is getting worse- went to PCP for this today and a MRI is scheduled for next week, but since going home pt states he is feeling unsteady when walking and feels like he will fall, and feels like his arm is getting weaker, no distress noted in triage- right hand grip strength noted to be less in triage

## 2013-10-20 NOTE — Progress Notes (Signed)
Subjective:    Patient ID: Alexander Navarro, male    DOB: 05/11/1986, 28 y.o.   MRN: [id number]  HPI  Pt presents to the clinic today with c/o worsening neck pain. He went to the ER 4/29 and was diagnosed with cervical radiculitis. He had pain and numbness that radiated into his bilateral arms. His right arm is more affected than his left. He reports he has developed a tremor in his right hand when he tries to text. He was started on prednisone and naproxen. He followed up with me, 4/30. He had had some improvement in his symptoms with the prednisone and naproxen. Cervical spine films of his next were normal. He returns today to discuss obtaining an MRI.  Review of Systems      Past Medical History  Diagnosis Date  . Frequent headaches     Current Outpatient Prescriptions  Medication Sig Dispense Refill  . cetirizine (ZYRTEC) 10 MG tablet Take 10 mg by mouth daily.      Marland Kitchen. esomeprazole (NEXIUM) 40 MG capsule Take 40 mg by mouth daily at 12 noon.      . naproxen (NAPROSYN) 500 MG tablet Take 1 tablet (500 mg total) by mouth 2 (two) times daily.  20 tablet  0  . predniSONE (DELTASONE) 20 MG tablet 3 tabs po day one, then 2 tabs daily x 4 days  11 tablet  0   No current facility-administered medications for this visit.    Allergies  Allergen Reactions  . Shrimp [Shellfish Allergy] Nausea And Vomiting    Family History  Problem Relation Age of Onset  . Hypertension Mother   . Thyroid disease Mother   . Heart disease Father   . Stomach cancer Maternal Grandmother   . Prostate cancer Maternal Grandfather     History   Social History  . Marital Status: Married    Spouse Name: N/A    Number of Children: N/A  . Years of Education: N/A   Occupational History  . Not on file.   Social History Main Topics  . Smoking status: Former Smoker    Types: Cigarettes  . Smokeless tobacco: Never Used  . Alcohol Use: 0.6 oz/week    1 Cans of beer per week     Comment: occasional  .  Drug Use: No  . Sexual Activity: Yes   Other Topics Concern  . Not on file   Social History Narrative  . No narrative on file     Constitutional: Denies fever, malaise, fatigue, headache or abrupt weight changes.  Musculoskeletal: Pt reports neck pain. Denies decrease in range of motion, difficulty with gait, muscle pain or joint pain and swelling.  Neurological: Pt reports numbness and tingling in his bilateral arms. Denies dizziness, difficulty with memory, difficulty with speech or problems with balance and coordination.   No other specific complaints in a complete review of systems (except as listed in HPI above).  Objective:   Physical Exam  BP 128/76  Pulse 90  Temp(Src) 98.3 F (36.8 C) (Oral)  Wt 296 lb (134.265 kg)  SpO2 98% Wt Readings from Last 3 Encounters:  10/20/13 296 lb (134.265 kg)  10/13/13 295 lb 4 oz (133.925 kg)  10/11/13 290 lb (131.543 kg)    General: Appears his stated age, well developed, well nourished in NAD. Cardiovascular: Normal rate and rhythm. S1,S2 noted.  No murmur, rubs or gallops noted. No JVD or BLE edema. No carotid bruits noted. Pulmonary/Chest: Normal effort and positive  vesicular breath sounds. No respiratory distress. No wheezes, rales or ronchi noted.  Musculoskeletal: No pain to palpation of cervical spine. Normal range of motion but pain with lateral flexion. Full strength of neck and shoulders. Normal ROM and strength of b/l arms. Normal gait.  Neurological: Alert and oriented. Cranial nerves grossly intact. Coordination normal. +DTRs bilaterally.   BMET    Component Value Date/Time   NA 139 10/12/2013 0114   K 4.2 10/12/2013 0114   CL 99 10/12/2013 0114   CO2 25 10/12/2013 0114   GLUCOSE 88 10/12/2013 0114   BUN 13 10/12/2013 0114   CREATININE 0.68 10/12/2013 0114   CALCIUM 9.9 10/12/2013 0114   GFRNONAA >90 10/12/2013 0114   GFRAA >90 10/12/2013 0114    Lipid Panel     Component Value Date/Time   CHOL 185 08/29/2013 1104    TRIG 108.0 08/29/2013 1104   HDL 39.70 08/29/2013 1104   CHOLHDL 5 08/29/2013 1104   VLDL 21.6 08/29/2013 1104   LDLCALC 124* 08/29/2013 1104    CBC    Component Value Date/Time   WBC 10.4 10/12/2013 0114   RBC 5.79 10/12/2013 0114   HGB 16.8 10/12/2013 0114   HCT 46.9 10/12/2013 0114   PLT 171 10/12/2013 0114   MCV 81.0 10/12/2013 0114   MCH 29.0 10/12/2013 0114   MCHC 35.8 10/12/2013 0114   RDW 12.5 10/12/2013 0114   LYMPHSABS 3.5 10/12/2013 0114   MONOABS 0.9 10/12/2013 0114   EOSABS 0.2 10/12/2013 0114   BASOSABS 0.0 10/12/2013 0114    Hgb A1C No results found for this basename: HGBA1C         Assessment & Plan:

## 2013-10-20 NOTE — Patient Instructions (Addendum)
See Shirlee LimerickMarion on your way out to schedule MRI    Cervical Radiculopathy Cervical radiculopathy happens when a nerve in the neck is pinched or bruised by a slipped (herniated) disk or by arthritic changes in the bones of the cervical spine. This can occur due to an injury or as part of the normal aging process. Pressure on the cervical nerves can cause pain or numbness that runs from your neck all the way down into your arm and fingers. CAUSES  There are many possible causes, including:  Injury.  Muscle tightness in the neck from overuse.  Swollen, painful joints (arthritis).  Breakdown or degeneration in the bones and joints of the spine (spondylosis) due to aging.  Bone spurs that may develop near the cervical nerves. SYMPTOMS  Symptoms include pain, weakness, or numbness in the affected arm and hand. Pain can be severe or irritating. Symptoms may be worse when extending or turning the neck. DIAGNOSIS  Your caregiver will ask about your symptoms and do a physical exam. He or she may test your strength and reflexes. X-rays, CT scans, and MRI scans may be needed in cases of injury or if the symptoms do not go away after a period of time. Electromyography (EMG) or nerve conduction testing may be done to study how your nerves and muscles are working. TREATMENT  Your caregiver may recommend certain exercises to help relieve your symptoms. Cervical radiculopathy can, and often does, get better with time and treatment. If your problems continue, treatment options may include:  Wearing a soft collar for short periods of time.  Physical therapy to strengthen the neck muscles.  Medicines, such as nonsteroidal anti-inflammatory drugs (NSAIDs), oral corticosteroids, or spinal injections.  Surgery. Different types of surgery may be done depending on the cause of your problems. HOME CARE INSTRUCTIONS   Put ice on the affected area.  Put ice in a plastic bag.  Place a towel between your skin and  the bag.  Leave the ice on for 15-20 minutes, 03-04 times a day or as directed by your caregiver.  If ice does not help, you can try using heat. Take a warm shower or bath, or use a hot water bottle as directed by your caregiver.  You may try a gentle neck and shoulder massage.  Use a flat pillow when you sleep.  Only take over-the-counter or prescription medicines for pain, discomfort, or fever as directed by your caregiver.  If physical therapy was prescribed, follow your caregiver's directions.  If a soft collar was prescribed, use it as directed. SEEK IMMEDIATE MEDICAL CARE IF:   Your pain gets much worse and cannot be controlled with medicines.  You have weakness or numbness in your hand, arm, face, or leg.  You have a high fever or a stiff, rigid neck.  You lose bowel or bladder control (incontinence).  You have trouble with walking, balance, or speaking. MAKE SURE YOU:   Understand these instructions.  Will watch your condition.  Will get help right away if you are not doing well or get worse. Document Released: 02/25/2001 Document Revised: 08/25/2011 Document Reviewed: 01/14/2011 [facility] Patient Information 2014 [place], [place].

## 2013-10-21 NOTE — ED Provider Notes (Signed)
I saw and evaluated the patient, reviewed the resident's note and I agree with the findings and plan.   EKG Interpretation None      Well appearing. Will send for MRI given unilateral arm and leg numbness  Marissa Lowrey M CampLyanne Coos, MD 10/21/13 [phone]

## 2013-10-24 ENCOUNTER — Telehealth: Payer: Self-pay | Admitting: Internal Medicine

## 2013-10-24 ENCOUNTER — Other Ambulatory Visit: Payer: Self-pay | Admitting: Internal Medicine

## 2013-10-24 NOTE — Telephone Encounter (Signed)
I just received a Denial for Patients MRI Cervical [facility]ine to be [facility] this Wednesday from [employer] . I have cancelled the appt and left a message for the patient that we had to cancel his MRI,  b/c his insurance would not approve it. Please be sure to cancel your order in Epic also. I will send the Denial letter for scanning.

## 2013-10-26 ENCOUNTER — Other Ambulatory Visit: Payer: Medicaid Other

## 2013-11-17 ENCOUNTER — Encounter: Payer: Self-pay | Admitting: Internal Medicine

## 2013-11-17 ENCOUNTER — Encounter (INDEPENDENT_AMBULATORY_CARE_PROVIDER_SITE_OTHER): Payer: Self-pay

## 2013-11-17 ENCOUNTER — Ambulatory Visit (INDEPENDENT_AMBULATORY_CARE_PROVIDER_SITE_OTHER): Payer: Medicaid Other | Admitting: Internal Medicine

## 2013-11-17 VITALS — BP 136/84 | HR 91 | Temp 98.4°F | Wt 310.0 lb

## 2013-11-17 DIAGNOSIS — R1012 Left upper quadrant pain: Secondary | ICD-10-CM

## 2013-11-17 LAB — LIPASE: LIPASE: 12 U/L (ref 11.0–59.0)

## 2013-11-17 NOTE — Progress Notes (Signed)
Subjective:    Patient ID: Alexander GrayJohn W Navarro, male    DOB: 07/12/1985, 28 y.o.   MRN: [id number]  HPI  Pt presents to the clinic today with c/o LUQ abdominal pain. He reports this started 3-4 weeks ago. The pain started out as pressure but now describes it as sharp, stabbing and burning at times. The pain seems to be getting worse. It is intermittent. It can radiate to his back.It is worse with lifting at work. He denies nausea, vomiting or blood in his stool. He is having normal BM's. He has tried tylenol and aleve without much relief. Position changes make it better.  Review of Systems      Past Medical History  Diagnosis Date  . Frequent headaches     Current Outpatient Prescriptions  Medication Sig Dispense Refill  . esomeprazole (NEXIUM) 40 MG capsule Take 40 mg by mouth daily at 12 noon.       No current facility-administered medications for this visit.    Allergies  Allergen Reactions  . Shrimp [Shellfish Allergy] Nausea And Vomiting    Family History  Problem Relation Age of Onset  . Hypertension Mother   . Thyroid disease Mother   . Heart disease Father   . Stomach cancer Maternal Grandmother   . Prostate cancer Maternal Grandfather     History   Social History  . Marital Status: Married    Spouse Name: N/A    Number of Children: N/A  . Years of Education: N/A   Occupational History  . Not on file.   Social History Main Topics  . Smoking status: Former Smoker    Types: Cigarettes  . Smokeless tobacco: Never Used  . Alcohol Use: 0.6 oz/week    1 Cans of beer per week     Comment: occasional  . Drug Use: No  . Sexual Activity: Yes   Other Topics Concern  . Not on file   Social History Narrative  . No narrative on file     Constitutional: Denies fever, malaise, fatigue, headache or abrupt weight changes.  Gastrointestinal: Pt reports abdominal pain. Denies  bloating, constipation, diarrhea or blood in the stool.    No other specific  complaints in a complete review of systems (except as listed in HPI above).   Objective:   Physical Exam   BP 136/84  Pulse 91  Temp(Src) 98.4 F (36.9 C) (Oral)  Wt 310 lb (140.615 kg)  SpO2 98% Wt Readings from Last 3 Encounters:  11/17/13 310 lb (140.615 kg)  10/20/13 296 lb (134.265 kg)  10/13/13 295 lb 4 oz (133.925 kg)    General: Appears his stated age, obese but well developed, well nourished in NAD. Cardiovascular: Normal rate and rhythm. S1,S2 noted.  No murmur, rubs or gallops noted. No JVD or BLE edema. No carotid bruits noted. Pulmonary/Chest: Normal effort and positive vesicular breath sounds. No respiratory distress. No wheezes, rales or ronchi noted.  Abdomen: Soft and tender in the LLQ. Normal bowel sounds, no bruits noted. No distention or masses noted. Liver, spleen and kidneys non palpable.   BMET    Component Value Date/Time   NA 139 10/12/2013 0114   K 4.2 10/12/2013 0114   CL 99 10/12/2013 0114   CO2 25 10/12/2013 0114   GLUCOSE 88 10/12/2013 0114   BUN 13 10/12/2013 0114   CREATININE 0.68 10/12/2013 0114   CALCIUM 9.9 10/12/2013 0114   GFRNONAA >90 10/12/2013 0114   GFRAA >90 10/12/2013 0114  Lipid Panel     Component Value Date/Time   CHOL 185 08/29/2013 1104   TRIG 108.0 08/29/2013 1104   HDL 39.70 08/29/2013 1104   CHOLHDL 5 08/29/2013 1104   VLDL 21.6 08/29/2013 1104   LDLCALC 124* 08/29/2013 1104    CBC    Component Value Date/Time   WBC 10.4 10/12/2013 0114   RBC 5.79 10/12/2013 0114   HGB 16.8 10/12/2013 0114   HCT 46.9 10/12/2013 0114   PLT 171 10/12/2013 0114   MCV 81.0 10/12/2013 0114   MCH 29.0 10/12/2013 0114   MCHC 35.8 10/12/2013 0114   RDW 12.5 10/12/2013 0114   LYMPHSABS 3.5 10/12/2013 0114   MONOABS 0.9 10/12/2013 0114   EOSABS 0.2 10/12/2013 0114   BASOSABS 0.0 10/12/2013 0114    Hgb A1C No results found for this basename: HGBA1C        Assessment & Plan:   LUQ abdominal pain:  ? Hiatal hernia He is already on nexium Will  check lipase to r/o issue with pancreas May need CT scan abdomen but will hold until blood work is back  Will follow up after labs, if worse go to ER immediately

## 2013-11-17 NOTE — Progress Notes (Signed)
Pre visit review using our clinic review tool, if applicable. No additional management support is needed unless otherwise documented below in the visit note. 

## 2013-11-17 NOTE — Patient Instructions (Signed)
Abdominal Pain, Adult °Many things can cause abdominal pain. Usually, abdominal pain is not caused by a disease and will improve without treatment. It can often be observed and treated at home. Your health care provider will do a physical exam and possibly order blood tests and X-rays to help determine the seriousness of your pain. However, in many cases, more time must pass before a clear cause of the pain can be found. Before that point, your health care provider may not know if you need more testing or further treatment. °HOME CARE INSTRUCTIONS  °Monitor your abdominal pain for any changes. The following actions may help to alleviate any discomfort you are experiencing: °· Only take over-the-counter or prescription medicines as directed by your health care provider. °· Do not take laxatives unless directed to do so by your health care provider. °· Try a clear liquid diet (broth, tea, or water) as directed by your health care provider. Slowly move to a bland diet as tolerated. °SEEK MEDICAL CARE IF: °· You have unexplained abdominal pain. °· You have abdominal pain associated with nausea or diarrhea. °· You have pain when you urinate or have a bowel movement. °· You experience abdominal pain that wakes you in the night. °· You have abdominal pain that is worsened or improved by eating food. °· You have abdominal pain that is worsened with eating fatty foods. °SEEK IMMEDIATE MEDICAL CARE IF:  °· Your pain does not go away within 2 hours. °· You have a fever. °· You keep throwing up (vomiting). °· Your pain is felt only in portions of the abdomen, such as the right side or the left lower portion of the abdomen. °· You pass bloody or black tarry stools. °MAKE SURE YOU: °· Understand these instructions.   °· Will watch your condition.   °· Will get help right away if you are not doing well or get worse.   °Document Released: 03/12/2005 Document Revised: 03/23/2013 Document Reviewed: 02/09/2013 °Exit[employer]® Patient  Information ©2014 [employer]. ° °

## 2013-11-21 ENCOUNTER — Other Ambulatory Visit: Payer: Self-pay | Admitting: Internal Medicine

## 2013-11-21 ENCOUNTER — Telehealth: Payer: Self-pay | Admitting: Internal Medicine

## 2013-11-21 DIAGNOSIS — R1012 Left upper quadrant pain: Secondary | ICD-10-CM

## 2013-11-21 NOTE — Telephone Encounter (Signed)
Called [employer] to get Ct Abdomen with contrast approved for the patient to have CT done today at [facility]. [employer] said that the information provided did not meet clinical guidelines to approve the CT. Patient was called and told to continue the Nexium for now and to call Ms Signa Kell back if the pain continues.

## 2013-12-09 ENCOUNTER — Encounter: Payer: Self-pay | Admitting: Internal Medicine

## 2013-12-09 ENCOUNTER — Ambulatory Visit (INDEPENDENT_AMBULATORY_CARE_PROVIDER_SITE_OTHER): Payer: Medicaid Other | Admitting: Internal Medicine

## 2013-12-09 VITALS — BP 148/80 | HR 105 | Temp 97.9°F | Wt 312.0 lb

## 2013-12-09 DIAGNOSIS — R1012 Left upper quadrant pain: Secondary | ICD-10-CM

## 2013-12-09 DIAGNOSIS — R197 Diarrhea, unspecified: Secondary | ICD-10-CM

## 2013-12-09 DIAGNOSIS — K219 Gastro-esophageal reflux disease without esophagitis: Secondary | ICD-10-CM

## 2013-12-09 NOTE — Progress Notes (Signed)
Subjective:    Patient ID: Alexander Navarro, male    DOB: 07/24/1985, 28 y.o.   MRN: [id number]  HPI  Pt presents to the clinic today to follow up abdominal pain. It is mostly located in the LUQ. This has been going on for a few months now. It seems to be worse with lifting and with movement. He also reports worsening reflux and new onset diarrhea. He does have intermittent nausea but denies vomiting. He has been taking Nexium without relief. He has also tried Tums, Rolaids, and Pepto Bismol. He does report that his stool has turned black since starting the Pepto but denies blood in his stool. He has not had a change in diet. He has not recently started any new medications. CT scan of the abdomen was denied by insurance. He would like to know what else could be done for the worsening abdominal pain.  Review of Systems      Past Medical History  Diagnosis Date  . Frequent headaches     Current Outpatient Prescriptions  Medication Sig Dispense Refill  . esomeprazole (NEXIUM) 40 MG capsule Take 40 mg by mouth daily at 12 noon.       No current facility-administered medications for this visit.    Allergies  Allergen Reactions  . Shrimp [Shellfish Allergy] Nausea And Vomiting    Family History  Problem Relation Age of Onset  . Hypertension Mother   . Thyroid disease Mother   . Heart disease Father   . Stomach cancer Maternal Grandmother   . Prostate cancer Maternal Grandfather     History   Social History  . Marital Status: Married    Spouse Name: N/A    Number of Children: N/A  . Years of Education: N/A   Occupational History  . Not on file.   Social History Main Topics  . Smoking status: Former Smoker    Types: Cigarettes  . Smokeless tobacco: Never Used  . Alcohol Use: 0.6 oz/week    1 Cans of beer per week     Comment: occasional  . Drug Use: No  . Sexual Activity: Yes   Other Topics Concern  . Not on file   Social History Narrative  . No narrative on file      Constitutional: Denies fever, malaise, fatigue, headache or abrupt weight changes.  Gastrointestinal: Pt reports LUQ abdominal pain and diarrhea. Denies bloating, constipation, or blood in the stool.  GU: Denies urgency, frequency, pain with urination, burning sensation, blood in urine, odor or discharge.   No other specific complaints in a complete review of systems (except as listed in HPI above).  Objective:   Physical Exam   BP 148/80  Pulse 105  Temp(Src) 97.9 F (36.6 C) (Oral)  Wt 312 lb (141.522 kg)  SpO2 97% Wt Readings from Last 3 Encounters:  12/09/13 312 lb (141.522 kg)  11/17/13 310 lb (140.615 kg)  10/20/13 296 lb (134.265 kg)    General: Appears his stated age, obese but well developed, well nourished in NAD. Cardiovascular: Normal rate and rhythm. S1,S2 noted.  No murmur, rubs or gallops noted. No JVD or BLE edema. No carotid bruits noted. Pulmonary/Chest: Normal effort and positive vesicular breath sounds. No respiratory distress. No wheezes, rales or ronchi noted.  Abdomen: Soft and tender in the epigastric/LUQ. Normal bowel sounds, no bruits noted. No distention or masses noted. Liver, spleen and kidneys non palpable.   BMET    Component Value Date/Time   NA  139 10/12/2013 0114   K 4.2 10/12/2013 0114   CL 99 10/12/2013 0114   CO2 25 10/12/2013 0114   GLUCOSE 88 10/12/2013 0114   BUN 13 10/12/2013 0114   CREATININE 0.68 10/12/2013 0114   CALCIUM 9.9 10/12/2013 0114   GFRNONAA >90 10/12/2013 0114   GFRAA >90 10/12/2013 0114    Lipid Panel     Component Value Date/Time   CHOL 185 08/29/2013 1104   TRIG 108.0 08/29/2013 1104   HDL 39.70 08/29/2013 1104   CHOLHDL 5 08/29/2013 1104   VLDL 21.6 08/29/2013 1104   LDLCALC 124* 08/29/2013 1104    CBC    Component Value Date/Time   WBC 10.4 10/12/2013 0114   RBC 5.79 10/12/2013 0114   HGB 16.8 10/12/2013 0114   HCT 46.9 10/12/2013 0114   PLT 171 10/12/2013 0114   MCV 81.0 10/12/2013 0114   MCH 29.0 10/12/2013  0114   MCHC 35.8 10/12/2013 0114   RDW 12.5 10/12/2013 0114   LYMPHSABS 3.5 10/12/2013 0114   MONOABS 0.9 10/12/2013 0114   EOSABS 0.2 10/12/2013 0114   BASOSABS 0.0 10/12/2013 0114    Hgb A1C No results found for this basename: HGBA1C        Assessment & Plan:   Worsening LUQ pain, nausea, and diarrhea:  CT scan was denied Will try to obtain US abdomen to r/o hiatal hernia If US denied, will refer to GI for possible upper endoscopy For now, increase the Nexium to 40 mg BID OK to take tums and rolaids, avoid pepto bismol Try Imodium for the diarrhea  Will followup with you after we find out if insurance will pay for ultrasound

## 2013-12-09 NOTE — Patient Instructions (Addendum)

## 2013-12-09 NOTE — Progress Notes (Signed)
Pre visit review using our clinic review tool, if applicable. No additional management support is needed unless otherwise documented below in the visit note. 

## 2013-12-15 ENCOUNTER — Ambulatory Visit
Admission: RE | Admit: 2013-12-15 | Discharge: 2013-12-15 | Disposition: A | Discharge status: Home / Self Care | Payer: Medicaid Other | Source: Ambulatory Visit | Attending: Internal Medicine | Admitting: Internal Medicine

## 2013-12-15 DIAGNOSIS — K219 Gastro-esophageal reflux disease without esophagitis: Secondary | ICD-10-CM

## 2013-12-15 DIAGNOSIS — R197 Diarrhea, unspecified: Secondary | ICD-10-CM

## 2013-12-15 DIAGNOSIS — R1012 Left upper quadrant pain: Secondary | ICD-10-CM

## 2014-03-21 ENCOUNTER — Encounter: Payer: Self-pay | Admitting: Internal Medicine

## 2014-03-21 ENCOUNTER — Ambulatory Visit (INDEPENDENT_AMBULATORY_CARE_PROVIDER_SITE_OTHER): Payer: Medicaid Other | Admitting: Internal Medicine

## 2014-03-21 VITALS — BP 140/88 | HR 87 | Temp 98.3°F | Wt 306.0 lb

## 2014-03-21 DIAGNOSIS — K219 Gastro-esophageal reflux disease without esophagitis: Secondary | ICD-10-CM

## 2014-03-21 MED ORDER — ESOMEPRAZOLE MAGNESIUM 40 MG PO CPDR: 40.0000 mg | DELAYED_RELEASE_CAPSULE | Freq: Two times a day (BID) | ORAL | Status: DC

## 2014-03-21 NOTE — Assessment & Plan Note (Addendum)
Worsening on Nexium OTC BID Called in prescription strength Nexium BID Ultrasound normal Insurance denies CT scan of abdomen Will refer to GI Diet information given for GERD- he is noncompliant with this

## 2014-03-21 NOTE — Progress Notes (Signed)
Subjective:    Patient ID: Alexander Navarro, male    DOB: 01/17/1986, 28 y.o.   MRN: [id number]  HPI  Pt presents to the clinic today to follow up LLQ and reflux. This has been going on for the last 6 months. We tried to get a CT scan of the abdomen which did get denied by his insurance. He did follow up 6/26 for the same. He was advised to start taking his Nexium BID. He told me he is not taking the prescription Nexium but the Nexium OTC. We also obtained and ultrasound of the abdomen which was normal. I advised him that we would refer him to GI for further evaluation. It looks as if the referral never got placed and he did not call back to inquire about this. Since that time, he reports that his LUQ pain has gotten worse. He does get bloated, burps frequently. He can feel the food regurgitating in his throat. The Nexium OTC is not as helpful as it once was. He has been taking zantac and Prilosec without relief. He would like to proceed with GI referral at this time.  Review of Systems      Past Medical History  Diagnosis Date  . Frequent headaches     Current Outpatient Prescriptions  Medication Sig Dispense Refill  . esomeprazole (NEXIUM) 40 MG capsule Take 40 mg by mouth 2 (two) times daily before a meal.        No current facility-administered medications for this visit.    Allergies  Allergen Reactions  . Shrimp [Shellfish Allergy] Nausea And Vomiting    Family History  Problem Relation Age of Onset  . Hypertension Mother   . Thyroid disease Mother   . Heart disease Father   . Stomach cancer Maternal Grandmother   . Prostate cancer Maternal Grandfather     History   Social History  . Marital Status: Married    Spouse Name: N/A    Number of Children: N/A  . Years of Education: N/A   Occupational History  . Not on file.   Social History Main Topics  . Smoking status: Former Smoker    Types: Cigarettes  . Smokeless tobacco: Never Used  . Alcohol Use: 0.6 oz/week     1 Cans of beer per week     Comment: occasional  . Drug Use: No  . Sexual Activity: Yes   Other Topics Concern  . Not on file   Social History Narrative  . No narrative on file     Constitutional: Denies fever, malaise, fatigue, headache or abrupt weight changes.  Respiratory: Denies difficulty breathing, shortness of breath, cough or sputum production.   Cardiovascular: Denies chest pain, chest tightness, palpitations or swelling in the hands or feet.  Gastrointestinal: Pt report LUQ pain and reflux. Denies  bloating, constipation, diarrhea or blood in the stool.    No other specific complaints in a complete review of systems (except as listed in HPI above).  Objective:   Physical Exam  BP 140/88  Pulse 87  Temp(Src) 98.3 F (36.8 C) (Oral)  Wt 306 lb (138.801 kg)  SpO2 99% Wt Readings from Last 3 Encounters:  03/21/14 306 lb (138.801 kg)  12/09/13 312 lb (141.522 kg)  11/17/13 310 lb (140.615 kg)    General: Appears his stated age, obese but well developed, well nourished in NAD. Cardiovascular: Normal rate and rhythm. S1,S2 noted.  No murmur, rubs or gallops noted. Pulmonary/Chest: Normal effort and positive  vesicular breath sounds. No respiratory distress. No wheezes, rales or ronchi noted.  Abdomen: Soft and tender in the LUQ. Normal bowel sounds, no bruits noted. No distention or masses noted. Liver, spleen and kidneys non palpable.   BMET    Component Value Date/Time   NA 139 10/12/2013 0114   K 4.2 10/12/2013 0114   CL 99 10/12/2013 0114   CO2 25 10/12/2013 0114   GLUCOSE 88 10/12/2013 0114   BUN 13 10/12/2013 0114   CREATININE 0.68 10/12/2013 0114   CALCIUM 9.9 10/12/2013 0114   GFRNONAA >90 10/12/2013 0114   GFRAA >90 10/12/2013 0114    Lipid Panel     Component Value Date/Time   CHOL 185 08/29/2013 1104   TRIG 108.0 08/29/2013 1104   HDL 39.70 08/29/2013 1104   CHOLHDL 5 08/29/2013 1104   VLDL 21.6 08/29/2013 1104   LDLCALC 124* 08/29/2013 1104     CBC    Component Value Date/Time   WBC 10.4 10/12/2013 0114   RBC 5.79 10/12/2013 0114   HGB 16.8 10/12/2013 0114   HCT 46.9 10/12/2013 0114   PLT 171 10/12/2013 0114   MCV 81.0 10/12/2013 0114   MCH 29.0 10/12/2013 0114   MCHC 35.8 10/12/2013 0114   RDW 12.5 10/12/2013 0114   LYMPHSABS 3.5 10/12/2013 0114   MONOABS 0.9 10/12/2013 0114   EOSABS 0.2 10/12/2013 0114   BASOSABS 0.0 10/12/2013 0114    Hgb A1C No results found for this basename: HGBA1C         Assessment & Plan:

## 2014-03-21 NOTE — Progress Notes (Signed)
Pre visit review using our clinic review tool, if applicable. No additional management support is needed unless otherwise documented below in the visit note. 

## 2014-03-21 NOTE — Patient Instructions (Addendum)

## 2014-03-31 ENCOUNTER — Encounter: Payer: Self-pay | Admitting: Internal Medicine

## 2014-04-10 ENCOUNTER — Ambulatory Visit: Payer: Medicaid Other | Admitting: Internal Medicine

## 2014-05-09 ENCOUNTER — Ambulatory Visit: Payer: Medicaid Other | Admitting: Internal Medicine

## 2014-07-10 ENCOUNTER — Ambulatory Visit (INDEPENDENT_AMBULATORY_CARE_PROVIDER_SITE_OTHER): Payer: 59 | Admitting: Internal Medicine

## 2014-07-10 ENCOUNTER — Encounter: Payer: Self-pay | Admitting: Internal Medicine

## 2014-07-10 VITALS — BP 132/68 | HR 84 | Ht 73.25 in | Wt 320.0 lb

## 2014-07-10 DIAGNOSIS — R1010 Upper abdominal pain, unspecified: Secondary | ICD-10-CM

## 2014-07-10 DIAGNOSIS — K219 Gastro-esophageal reflux disease without esophagitis: Secondary | ICD-10-CM

## 2014-07-10 NOTE — Patient Instructions (Signed)

## 2014-07-10 NOTE — Progress Notes (Signed)
HISTORY OF PRESENT ILLNESS:  Alexander Navarro is a 29 y.o. male who presents with with his wife, upon referral from his primary care provider, regarding ongoing problems with upper abdominal pain. The patient reports a four-month history of left upper quadrant pain which she describes as burning poker-like. The discomfort occurs intermittently. It may occur daily or not for 1 week. Discomfort seems to be exacerbated by sitting, lifting heavy items, or eating certain spicy foods. The discomfort is relieved with positional change and belching. On occasion, he has self-induced vomiting which has resulted in relief. He was having significant classic reflux symptoms as manifested by pyrosis. He has been taking Nexium OTC to twice daily at the recommendation of his PCP. This has helped reflux symptoms, but not the left upper quadrant pain. He did undergo an abdominal ultrasound which revealed fatty liver. No other significant abnormalities. He does take NSAIDs on a daily basis to deal with back and leg pain. Over the past several months he has had weight gain. No difficulties with his bowels and no bleeding. He drinks carbonated beverages but does not smoke. Review of outside blood work from June 2015 reveals normal lipase. Blood work from April 2015 reveals normal CBC with hemoglobin 16.8 and normal comprehensive metabolic panel including liver tests.  REVIEW OF SYSTEMS:  All non-GI ROS negative except for back pain, leg pain, cough  Past Medical History  Diagnosis Date  . Frequent headaches   . GERD (gastroesophageal reflux disease)   . Frequent headaches     Past Surgical History  Procedure Laterality Date  . Wisdom tooth extraction      Social History Alexander GrayJohn W Misch  reports that he quit smoking about 2 years ago. His smoking use included Cigarettes. He has a 12 pack-year smoking history. He has never used smokeless tobacco. He reports that he drinks about 0.6 oz of alcohol per week. He reports that  he does not use illicit drugs.  family history includes Heart disease in his father; Hypertension in his mother; Prostate cancer in his maternal grandfather; Stomach cancer in his maternal grandmother; Throat cancer in his paternal aunt; Thyroid disease in his mother. There is no history of Colon cancer, Colon polyps, Kidney disease, Esophageal cancer, or Gallbladder disease.  Allergies  Allergen Reactions  . Shrimp [Shellfish Allergy] Nausea And Vomiting       PHYSICAL EXAMINATION: Vital signs: BP 132/68 mmHg  Pulse 84  Ht 6' 1.25" (1.861 m)  Wt 320 lb (145.151 kg)  BMI 41.91 kg/m2  Constitutional: generally well-appearing, no acute distress Psychiatric: alert and oriented x3, cooperative Eyes: extraocular movements intact, anicteric, conjunctiva pink Mouth: oral pharynx moist, no lesions Neck: supple no lymphadenopathy Cardiovascular: heart regular rate and rhythm, no murmur Lungs: clear to auscultation bilaterally Abdomen: soft, nontender, nondistended, no obvious ascites, no peritoneal signs, normal bowel sounds, no organomegaly Extremities: no lower extremity edema bilaterally Skin: no lesions on visible extremities Neuro: No focal deficits.   ASSESSMENT:  #1. Chronic persistent left upper quadrant pain as described. Most features consistent with musculoskeletal though sometimes affected by meals, belching, vomiting (self-induced). He does take chronic NSAIDs. Rule out ulcer. #2. Chronic GERD. Symptoms improved on PPI #3. Morbid obesity  PLAN:  #1. Reflux precautions with attention to weight loss #2. Continue PPI #3. Schedule diagnostic upper endoscopy to evaluate pain and chronic reflux symptoms.The nature of the procedure, as well as the risks, benefits, and alternatives were carefully and thoroughly reviewed with the patient. Ample time for  discussion and questions allowed. The patient understood, was satisfied, and agreed to proceed.

## 2014-07-13 ENCOUNTER — Encounter: Payer: Self-pay | Admitting: Internal Medicine

## 2014-07-13 ENCOUNTER — Ambulatory Visit (AMBULATORY_SURGERY_CENTER): Payer: 59 | Admitting: Internal Medicine

## 2014-07-13 VITALS — BP 128/62 | HR 71 | Temp 97.3°F | Resp 19 | Ht 73.0 in | Wt 320.0 lb

## 2014-07-13 DIAGNOSIS — K2980 Duodenitis without bleeding: Secondary | ICD-10-CM

## 2014-07-13 DIAGNOSIS — R1012 Left upper quadrant pain: Secondary | ICD-10-CM

## 2014-07-13 DIAGNOSIS — G8929 Other chronic pain: Secondary | ICD-10-CM

## 2014-07-13 DIAGNOSIS — R1011 Right upper quadrant pain: Secondary | ICD-10-CM

## 2014-07-13 DIAGNOSIS — K219 Gastro-esophageal reflux disease without esophagitis: Secondary | ICD-10-CM

## 2014-07-13 MED ORDER — SODIUM CHLORIDE 0.9 % IV SOLN: 500.0000 mL | INTRAVENOUS | Status: DC

## 2014-07-13 NOTE — Op Note (Signed)
[facility] [address] [place], [id number]   ENDOSCOPY PROCEDURE REPORT  PATIENT: [name], Alexander W  MR#: [id number] BIRTHDATE: August 04, 1985 , 28  yrs. old GENDER: male ENDOSCOPIST: Alexander CedarJohn N Mare Ludtke Jr, MD REFERRED BY:  .  Nicki Reaperegina Baity , NP PROCEDURE DATE:  07/13/2014 PROCEDURE:  EGD w/ biopsy ASA CLASS:     Class II INDICATIONS:  abdominal pain in upper left quadrant. MEDICATIONS: Monitored anesthesia care and Propofol 200 mg IV TOPICAL ANESTHETIC: none  DESCRIPTION OF PROCEDURE: After the risks benefits and alternatives of the procedure were thoroughly explained, informed consent was obtained.  The LB [id number] endoscope was introduced through the mouth and advanced to the second portion of the duodenum , Without limitations.  The instrument was slowly withdrawn as the mucosa was fully examined.  EXAM: The esophagus and gastroesophageal junction were completely normal in appearance.  The stomach was entered and closely examined.The antrum, angularis, and lesser curvature were well visualized, including a retroflexed view of the cardia and fundus. The stomach wall was normally distensable.  The scope passed easily through the pylorus into the duodenum. The bulb revealed mild erythema consistent with nonerosive duodenitis. The post bulbar duodenum was normal.  Retroflexed views revealed no abnormalities. The scope was then withdrawn from the patient and the procedure completed. COMPLICATIONS: There were no immediate complications.  ENDOSCOPIC IMPRESSION: 1   Mild duodenitis 2. Otherwise normal EGD RECOMMENDATIONS: 1.  Anti-reflux regimen to be followed 2.  Continue PPI (Nexium) 3.  Rx CLO if positive 4.  My office will arrange for you to have a Solid Phase Gastric Emptying Scan performed.  This is a radiology test that gives  an idea of how well your stomach empties. 5.  Avoid NSAIDS for two weeks to see if this helps sure burning discomfort 6.  Call  office next 2-3 days to schedule an office appointment with Dr. Marina GoodellPerry in about 6 weeks for follow-up  REPEAT EXAM:  eSigned:  Roxy CedarJohn N Zyrell Carmean Jr, MD 07/13/2014 2:32 PM    CC:The Patient  , Nicki Reaperegina Baity, NP

## 2014-07-13 NOTE — Patient Instructions (Signed)
Discharge instructions given. Avoid NSAIDS for two weeks . Office will arrange for a solid phase Gastric Emptying Scan. Call office to schedule an office appointment. YOU HAD AN ENDOSCOPIC PROCEDURE TODAY AT THE [facility]: Refer to the procedure report that was given to you for any specific questions about what was found during the examination.  If the procedure report does not answer your questions, please call your gastroenterologist to clarify.  If you requested that your care partner not be given the details of your procedure findings, then the procedure report has been included in a sealed envelope for you to review at your convenience later.  YOU SHOULD EXPECT: Some feelings of bloating in the abdomen. Passage of more gas than usual.  Walking can help get rid of the air that was put into your GI tract during the procedure and reduce the bloating. If you had a lower endoscopy (such as a colonoscopy or flexible sigmoidoscopy) you may notice spotting of blood in your stool or on the toilet paper. If you underwent a bowel prep for your procedure, then you may not have a normal bowel movement for a few days.  DIET: Your first meal following the procedure should be a light meal and then it is ok to progress to your normal diet.  A half-sandwich or bowl of soup is an example of a good first meal.  Heavy or fried foods are harder to digest and may make you feel nauseous or bloated.  Likewise meals heavy in dairy and vegetables can cause extra gas to form and this can also increase the bloating.  Drink plenty of fluids but you should avoid alcoholic beverages for 24 hours.  ACTIVITY: Your care partner should take you home directly after the procedure.  You should plan to take it easy, moving slowly for the rest of the day.  You can resume normal activity the day after the procedure however you should NOT DRIVE or use heavy machinery for 24 hours (because of the sedation medicines used during  the test).    SYMPTOMS TO REPORT IMMEDIATELY: A gastroenterologist can be reached at any hour.  During normal business hours, 8:30 AM to 5:00 PM Monday through Friday, call [phone].  After hours and on weekends, please call the GI answering service at [phone] who will take a message and have the physician on call contact you.   Following upper endoscopy (EGD)  Vomiting of blood or coffee ground material  New chest pain or pain under the shoulder blades  Painful or persistently difficult swallowing  New shortness of breath  Fever of 100F or higher  Black, tarry-looking stools  FOLLOW UP: If any biopsies were taken you will be contacted by phone or by letter within the next 1-3 weeks.  Call your gastroenterologist if you have not heard about the biopsies in 3 weeks.  Our staff will call the home number listed on your records the next business day following your procedure to check on you and address any questions or concerns that you may have at that time regarding the information given to you following your procedure. This is a courtesy call and so if there is no answer at the home number and we have not heard from you through the emergency physician on call, we will assume that you have returned to your regular daily activities without incident.  SIGNATURES/CONFIDENTIALITY: You and/or your care partner have signed paperwork which will be entered into your electronic medical record.  These signatures attest to the fact that that the information above on your After Visit Summary has been reviewed and is understood.  Full responsibility of the confidentiality of this discharge information lies with you and/or your care-partner.

## 2014-07-13 NOTE — Progress Notes (Signed)
Patient awakening,vss,report to rn 

## 2014-07-13 NOTE — Progress Notes (Signed)
Called to room to assist during endoscopic procedure.  Patient ID and intended procedure confirmed with present staff. Received instructions for my participation in the procedure from the performing physician.  

## 2014-07-14 ENCOUNTER — Telehealth: Payer: Self-pay | Admitting: *Deleted

## 2014-07-14 ENCOUNTER — Other Ambulatory Visit: Payer: Self-pay

## 2014-07-14 ENCOUNTER — Telehealth: Payer: Self-pay

## 2014-07-14 DIAGNOSIS — R1084 Generalized abdominal pain: Secondary | ICD-10-CM

## 2014-07-14 LAB — HELICOBACTER PYLORI SCREEN-BIOPSY: UREASE: NEGATIVE

## 2014-07-14 NOTE — Telephone Encounter (Deleted)
  Follow up Call-  Call back number 07/13/2014  Permission to leave phone message Yes     Patient questions:  Do you have a fever, pain , or abdominal swelling? {yes no:314532} Pain Score  {NUMBERS; 0-10:5044} *  Have you tolerated food without any problems? {yes no:314532}  Have you been able to return to your normal activities? {yes no:314532}  Do you have any questions about your discharge instructions: Diet   {yes no:314532} Medications  {yes no:314532} Follow up visit  {yes no:314532}  Do you have questions or concerns about your Care? {yes no:314532}  Actions: * If pain score is 4 or above: {ACTION; [facility] ENDO PAIN >4:21563::"No action needed, pain <4."}

## 2014-07-14 NOTE — Telephone Encounter (Signed)
No answer. Name identifier. Message left to call if any questions or concerns. 

## 2014-07-14 NOTE — Telephone Encounter (Signed)
Pt [facility] 07/31/14@7 :30am. Pt to arrive there at 7:15am. Pt to be NPO after midnight and hold nexium for 24 hours prior to test.

## 2014-07-17 ENCOUNTER — Other Ambulatory Visit: Payer: Self-pay

## 2014-07-17 MED ORDER — ESOMEPRAZOLE MAGNESIUM 40 MG PO CPDR: 40.0000 mg | DELAYED_RELEASE_CAPSULE | Freq: Two times a day (BID) | ORAL | Status: DC

## 2014-07-17 NOTE — Telephone Encounter (Signed)
Spoke with pt and he is aware. 

## 2014-07-31 ENCOUNTER — Ambulatory Visit (HOSPITAL_COMMUNITY): Payer: 59

## 2014-08-23 ENCOUNTER — Ambulatory Visit: Payer: 59 | Admitting: Internal Medicine

## 2014-08-25 ENCOUNTER — Encounter (HOSPITAL_COMMUNITY): Payer: Self-pay | Admitting: Emergency Medicine

## 2014-08-25 ENCOUNTER — Telehealth: Payer: Self-pay | Admitting: Internal Medicine

## 2014-08-25 ENCOUNTER — Emergency Department (HOSPITAL_COMMUNITY)
Admission: EM | Admit: 2014-08-25 | Discharge: 2014-08-25 | Disposition: A | Discharge status: Home / Self Care | Payer: 59 | Attending: Emergency Medicine | Admitting: Emergency Medicine

## 2014-08-25 ENCOUNTER — Emergency Department (HOSPITAL_COMMUNITY): Payer: 59

## 2014-08-25 DIAGNOSIS — Z87891 Personal history of nicotine dependence: Secondary | ICD-10-CM | POA: Insufficient documentation

## 2014-08-25 DIAGNOSIS — M940 Chondrocostal junction syndrome [Tietze]: Secondary | ICD-10-CM

## 2014-08-25 DIAGNOSIS — Z79899 Other long term (current) drug therapy: Secondary | ICD-10-CM | POA: Diagnosis not present

## 2014-08-25 DIAGNOSIS — K219 Gastro-esophageal reflux disease without esophagitis: Secondary | ICD-10-CM | POA: Diagnosis not present

## 2014-08-25 DIAGNOSIS — E669 Obesity, unspecified: Secondary | ICD-10-CM | POA: Diagnosis not present

## 2014-08-25 DIAGNOSIS — R000 Tachycardia, unspecified: Secondary | ICD-10-CM | POA: Insufficient documentation

## 2014-08-25 DIAGNOSIS — R079 Chest pain, unspecified: Secondary | ICD-10-CM | POA: Diagnosis present

## 2014-08-25 LAB — CBC WITH DIFFERENTIAL/PLATELET
BASOS ABS: 0 10*3/uL (ref 0.0–0.1)
BASOS PCT: 0 % (ref 0–1)
EOS ABS: 0.1 10*3/uL (ref 0.0–0.7)
Eosinophils Relative: 1 % (ref 0–5)
HCT: 44 % (ref 39.0–52.0)
HEMOGLOBIN: 15.5 g/dL (ref 13.0–17.0)
Lymphocytes Relative: 26 % (ref 12–46)
Lymphs Abs: 2.4 10*3/uL (ref 0.7–4.0)
MCH: 27.9 pg (ref 26.0–34.0)
MCHC: 35.2 g/dL (ref 30.0–36.0)
MCV: 79.1 fL (ref 78.0–100.0)
MONO ABS: 0.5 10*3/uL (ref 0.1–1.0)
MONOS PCT: 6 % (ref 3–12)
NEUTROS PCT: 67 % (ref 43–77)
Neutro Abs: 6.3 10*3/uL (ref 1.7–7.7)
Platelets: 149 10*3/uL — ABNORMAL LOW (ref 150–400)
RBC: 5.56 MIL/uL (ref 4.22–5.81)
RDW: 12.8 % (ref 11.5–15.5)
WBC: 9.3 10*3/uL (ref 4.0–10.5)

## 2014-08-25 LAB — I-STAT CHEM 8, ED
BUN: 15 mg/dL (ref 6–23)
Calcium, Ion: 1.2 mmol/L (ref 1.12–1.23)
Chloride: 102 mmol/L (ref 96–112)
Creatinine, Ser: 0.9 mg/dL (ref 0.50–1.35)
Glucose, Bld: 107 mg/dL — ABNORMAL HIGH (ref 70–99)
HEMATOCRIT: 49 % (ref 39.0–52.0)
HEMOGLOBIN: 16.7 g/dL (ref 13.0–17.0)
Potassium: 3.8 mmol/L (ref 3.5–5.1)
SODIUM: 140 mmol/L (ref 135–145)
TCO2: 24 mmol/L (ref 0–100)

## 2014-08-25 LAB — I-STAT TROPONIN, ED: Troponin i, poc: 0 ng/mL (ref 0.00–0.08)

## 2014-08-25 LAB — D-DIMER, QUANTITATIVE: D-Dimer, Quant: <0.27 ug/mL-FEU (ref 0.00–0.48)

## 2014-08-25 MED ORDER — KETOROLAC TROMETHAMINE 60 MG/2ML IM SOLN
60.0000 mg | Freq: Once | INTRAMUSCULAR | Status: AC
  Administered 2014-08-25: 60 mg via INTRAMUSCULAR
  Filled 2014-08-25: qty 2

## 2014-08-25 MED ORDER — NAPROXEN SODIUM 550 MG PO TABS: 550.0000 mg | ORAL_TABLET | Freq: Two times a day (BID) | ORAL | Status: DC | PRN

## 2014-08-25 MED ORDER — METHOCARBAMOL 500 MG PO TABS: 500.0000 mg | ORAL_TABLET | Freq: Two times a day (BID) | ORAL | Status: DC

## 2014-08-25 NOTE — Telephone Encounter (Signed)
PLEASE NOTE: All timestamps contained within this report are represented as [place] Standard Time. CONFIDENTIALTY NOTICE: This fax transmission is intended only for the addressee. It contains information that is legally privileged, confidential or otherwise protected from use or disclosure. If you are not the intended recipient, you are strictly prohibited from reviewing, disclosing, copying using or disseminating any of this information or taking any action in reliance on or regarding this information. If you have received this fax in error, please notify us immediately by telephone so that we can arrange for its return to us. Phone: [phone], Toll-Free: [phone], Fax: [phone] Page: 1 of 2 Call Id: [id number] [facility] - Client TELEPHONE ADVICE RECORD [facility] Patient Name: Alexander Navarro Gender: Male DOB: 07/29/1985 Age: [age] Y 4 M 4 D Return Phone Number: [phone] (Primary) Address: [place]: [place] [id number] Client [facility] - Client Client [facility] Day Physician Nicki ReaperBaity, Regina Contact Type Call Call Type Triage / Clinical Relationship To Patient Self Appointment Disposition EMR Appointment Not Necessary Return Phone Number Unavailable Chief Complaint CHEST PAIN (>=21 years) - pain, pressure, heaviness or tightness Initial Comment Caller states he is having chest pains; was transferred over [employer] office GOTO Facility Not Listed [facility] ED PreDisposition Go to ED Info pasted into Epic Yes Nurse Assessment Nurse: Harlon FlorWhitaker, RN, Darl PikesSusan Date/Time (Eastern Time): 08/25/2014 4:50:29 PM Confirm and document reason for call. If symptomatic, describe symptoms. ---Caller states he is having chest pains; was transferred over [place] office Pains have been in left center to the left axilla and has been intermittent. He is burping on the phone. 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 Has the patient traveled out of the country within the last 30 days? ---No Does the patient require triage? ---Yes Related visit to physician within the last 2 weeks? ---No Does the PT have any chronic conditions? (i.e. diabetes, asthma, etc.) ---Yes List chronic conditions. ---nexium Guidelines Guideline Title Affirmed Question Affirmed Notes Nurse Date/Time [name](Eastern Time) Chest Pain [1] Intermittent chest pain or "angina" AND [2] increasing in severity or frequency (Exception: pains lasting a few seconds) Whitaker, RN, Darl PikesSusan 08/25/2014 4:52:29 PM Disp. Time [name](Eastern Time) Disposition Final User 08/25/2014 4:44:47 PM Send to Urgent Queue Thibou, Jasmine PLEASE NOTE: All timestamps contained within this report are represented as [place] Standard Time. CONFIDENTIALTY NOTICE: This fax transmission is intended only for the addressee. It contains information that is legally privileged, confidential or otherwise protected from use or disclosure. If you are not the intended recipient, you are strictly prohibited from reviewing, disclosing, copying using or disseminating any of this information or taking any action in reliance on or regarding this information. If you have received this fax in error, please notify us immediately by telephone so that we can arrange for its return to us. Phone: [phone], Toll-Free: [phone], Fax: [phone] Page: 2 of 2 Call Id: [id number] 08/25/2014 4:54:18 PM Go to ED Now Yes Harlon FlorWhitaker, RN, Helane RimaSusan Caller Understands: Yes Disagree/Comply: Comply Care Advice Given Per Guideline GO TO ED NOW: You need to be seen in the Emergency Department. Go to the ER at ___________ Hospital. Leave now. Drive carefully. * Severe difficulty breathing occurs CALL EMS IF: * Passes out or becomes too weak to stand * You become worse. CARE ADVICE given per Chest Pain (Adult) guideline. DRIVING: Another adult should drive. After Care  Instructions Given Call Event Type User Date / Time Description  Referrals [facility] - ED

## 2014-08-25 NOTE — ED Notes (Signed)
Pt c/o left chest pain radiating into left shoulder off and on since yesterday.  Pt denies shortness of breath, nausea or diaphoresis.

## 2014-08-25 NOTE — Telephone Encounter (Signed)
[facility] - Client TELEPHONE ADVICE RECORD   [facility]     Patient Name: Herschell DimesJOHN Reising Initial Comment Caller states he is having chest pains; was transferred over [place] office   DOB: 06/12/1986      Nurse Assessment  Nurse: Harlon FlorWhitaker, RN, Darl PikesSusan Date/Time (Eastern Time): 08/25/2014 4:50:29 PM  Confirm and document reason for call. If symptomatic, describe symptoms. ---Caller states he is having chest pains; was transferred over [place] office Pains have been in left center to the left axilla and has been intermittent. He is burping on the phone. 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  Has the patient traveled out of the country within the last 30 days? ---No  Does the patient require triage? ---Yes  Related visit to physician within the last 2 weeks? ---No  Does the PT have any chronic conditions? (i.e. diabetes, asthma, etc.) ---Yes  List chronic conditions. ---nexium    Guidelines     Guideline Title Affirmed Question Affirmed Notes   Chest Pain [1] Intermittent chest pain or "angina" AND [2] increasing in severity or frequency (Exception: pains lasting a few seconds)    Final Disposition User   Go to ED Now Harlon FlorWhitaker, RN, Darl PikesSusan

## 2014-08-25 NOTE — ED Notes (Signed)
Pt reports cp since yesterday in mid chest radiating to left chest.  Pt reports back pain, denies n/v/d.  Pt alert and oriented.

## 2014-08-25 NOTE — Discharge Instructions (Signed)
Costochondritis Costochondritis, sometimes called Tietze syndrome, is a swelling and irritation (inflammation) of the tissue (cartilage) that connects your ribs with your breastbone (sternum). It causes pain in the chest and rib area. Costochondritis usually goes away on its own over time. It can take up to 6 weeks or longer to get better, especially if you are unable to limit your activities. CAUSES  Some cases of costochondritis have no known cause. Possible causes include:  Injury (trauma).  Exercise or activity such as lifting.  Severe coughing. SIGNS AND SYMPTOMS  Pain and tenderness in the chest and rib area.  Pain that gets worse when coughing or taking deep breaths.  Pain that gets worse with specific movements. DIAGNOSIS  Your health care provider will do a physical exam and ask about your symptoms. Chest X-rays or other tests may be done to rule out other problems. TREATMENT  Costochondritis usually goes away on its own over time. Your health care provider may prescribe medicine to help relieve pain. HOME CARE INSTRUCTIONS   Avoid exhausting physical activity. Try not to strain your ribs during normal activity. This would include any activities using chest, abdominal, and side muscles, especially if heavy weights are used.  Apply ice to the affected area for the first 2 days after the pain begins.  Put ice in a plastic bag.  Place a towel between your skin and the bag.  Leave the ice on for 20 minutes, 2-3 times a day.  Only take over-the-counter or prescription medicines as directed by your health care provider. SEEK MEDICAL CARE IF:  You have redness or swelling at the rib joints. These are signs of infection.  Your pain does not go away despite rest or medicine. SEEK IMMEDIATE MEDICAL CARE IF:   Your pain increases or you are very uncomfortable.  You have shortness of breath or difficulty breathing.  You cough up blood.  You have worse chest pains,  sweating, or vomiting.  You have a fever or persistent symptoms for more than 2-3 days.  You have a fever and your symptoms suddenly get worse. MAKE SURE YOU:   Understand these instructions.  Will watch your condition.  Will get help right away if you are not doing well or get worse. Document Released: 03/12/2005 Document Revised: 03/23/2013 Document Reviewed: 01/04/2013 ExitCare Patient Information 2015 [employer]. This information is not intended to replace advice given to you by your health care provider. Make sure you discuss any questions you have with your health care provider.  

## 2014-08-25 NOTE — ED Provider Notes (Signed)
CSN: [id number]     Arrival date & time 08/25/14  1807 History   First MD Initiated Contact with Patient 08/25/14 2100     Chief Complaint  Patient presents with  . Chest Pain     (Consider location/radiation/quality/duration/timing/severity/associated sxs/prior Treatment) HPI   29 year old male with history of obesity, GERD, frequent headache presents for evaluation of chest pain. Patient reports since yesterday afternoon he has been experiencing persistent pain to his left side of chest radiates to his back and to his left arm. Pain is described as a pressure sensation with some burning sensation. Initial pain was 5 out of 10 that worsened to 8 out of 10 and now it is 5 out of 10. He has been taking some Aleve with some relief. There is no associated lightheadedness, dizziness, chest fever, chills, headache, diaphoresis, abdominal pain, nausea vomiting diarrhea, rash. No recent strenuous activities or heavy lifting. He has history of GERD but states that this felt different. He denies any prior history of PE or DVT, no recent surgery, prolonged bed rest, unilateral leg swelling or calf pain or any recent travel. He does admits to having intermittent heart palpitation worsening in the past several days. He also admits that he drinks a lot of sodas, at least 2 L daily. Reported family history of heart disease, father has CHF at age of 28.  Past Medical History  Diagnosis Date  . Frequent headaches   . GERD (gastroesophageal reflux disease)   . Obesity   . Fatty liver    Past Surgical History  Procedure Laterality Date  . Wisdom tooth extraction     Family History  Problem Relation Age of Onset  . Hypertension Mother   . Thyroid disease Mother   . Heart disease Father   . Stomach cancer Maternal Grandmother   . Prostate cancer Maternal Grandfather   . Colon cancer Neg Hx   . Colon polyps Neg Hx   . Kidney disease Neg Hx   . Esophageal cancer Neg Hx   . Throat cancer Paternal Aunt    . Gallbladder disease Neg Hx    History  Substance Use Topics  . Smoking status: Former Smoker -- 1.50 packs/day for 8 years    Types: Cigarettes    Quit date: 06/16/2012  . Smokeless tobacco: Never Used  . Alcohol Use: 0.6 oz/week    1 Cans of beer per week     Comment: occasional    Review of Systems  All other systems reviewed and are negative.     Allergies  Shrimp  Home Medications   Prior to Admission medications   Medication Sig Start Date End Date Taking? Authorizing Provider  esomeprazole (NEXIUM) 40 MG capsule Take 1 capsule (40 mg total) by mouth 2 (two) times daily before a meal. 07/17/14  Yes Hilarie FredricksonJohn N Perry, MD  naproxen sodium (ANAPROX) 220 MG tablet Take 440 mg by mouth daily as needed.   Yes Historical Provider, MD   BP 148/87 mmHg  Pulse 123  Temp(Src) 98.5 F (36.9 C) (Oral)  Resp 24  Ht 6\' 3"  (1.905 m)  SpO2 95% Physical Exam  Constitutional: He is oriented to person, place, and time. He appears well-developed and well-nourished. No distress.  Caucasian male, tall stature, appears to be in no acute distress.  HENT:  Head: Atraumatic.  Eyes: Conjunctivae are normal.  Neck: Normal range of motion. Neck supple. No JVD present.  Cardiovascular: Intact distal pulses.   Tachycardia without murmurs rubs or  gallops  Pulmonary/Chest: Effort normal and breath sounds normal. He exhibits no tenderness (tenderness to left anterior chest wall on palpation without crepitus or emphysema.).  Abdominal: Soft. There is no tenderness.  Musculoskeletal: He exhibits no edema.  Neurological: He is alert and oriented to person, place, and time.  Skin: No rash noted.  Psychiatric: He has a normal mood and affect.    ED Course  Procedures (including critical care time)  Patient presents with left-sided chest pain. Pain is reproducible on exam. Chest x-ray shows no acute cardiopulmonary disease. Electrolytes are reassuring. EKG shows sinus tach without ischemic changes.  Although he has no significant risk factors for PE he was initially tachycardic with a heart rate of 128 that is resolved despite any specific treatment. He does not appears dehydrated however I cannot rule out PE, therefore I will obtain a d-dimer. Suspect musculoskeletal pain. Low suspicion for aortic dissection. Patient has intact pulses throughout.  11:22 PM D-dimer is negative, low suspicion for PE or aortic dissection. Troponin is negative, HEART score of 1, low risk for MACE. Patient felt much better after receiving anti-inflammatory medication, Toradol.  no acute emergency medical condition identified in the ED. Patient stable for discharge with close follow-up.  Return precaution discussed.  Suspect MSK etiology.  Labs Review Labs Reviewed  CBC WITH DIFFERENTIAL/PLATELET - Abnormal; Notable for the following:    Platelets 149 (*)    All other components within normal limits  I-STAT CHEM 8, ED - Abnormal; Notable for the following:    Glucose, Bld 107 (*)    All other components within normal limits  D-DIMER, QUANTITATIVE  I-STAT TROPOININ, ED    Imaging Review Dg Chest 2 View  08/25/2014   CLINICAL DATA:  Acute onset of generalized chest pain. Initial encounter.  EXAM: CHEST  2 VIEW  COMPARISON:  Chest radiograph performed 09/06/2013  FINDINGS: The lungs are well-aerated and clear. There is no evidence of focal opacification, pleural effusion or pneumothorax.  The heart is normal in size; the mediastinal contour is within normal limits. No acute osseous abnormalities are seen.  IMPRESSION: No acute cardiopulmonary process seen.   Electronically Signed   By: Roanna Raider M.D.   On: 08/25/2014 19:07     EKG Interpretation   Date/Time:  Friday August 25 2014 18:17:58 EST Ventricular Rate:  122 PR Interval:  180 QRS Duration: 92 QT Interval:  306 QTC Calculation: 436 R Axis:   40 Text Interpretation:  Sinus tachycardia Otherwise normal ECG Confirmed by  BEATON  MD, ROBERT  ([id number]) on 08/25/2014 6:26:09 PM      MDM   Final diagnoses:  Costochondritis, acute    BP 136/80 mmHg  Pulse 82  Temp(Src) 98.5 F (36.9 C) (Oral)  Resp 16  Ht  (1.905 m)  SpO2 97%  I have reviewed nursing notes and vital signs. I personally reviewed the imaging tests through PACS system  I reviewed available ER/hospitalization records thought the EMR     Fayrene Helper, PA-C 08/25/14 2326  Nelva Nay, MD 08/26/14 1054

## 2014-08-25 NOTE — ED Notes (Signed)
Pt made aware to return if symptoms worsen or if any life threatening symptoms occur.   

## 2014-08-28 ENCOUNTER — Ambulatory Visit: Payer: 59 | Admitting: Internal Medicine

## 2014-09-27 ENCOUNTER — Ambulatory Visit: Payer: 59 | Admitting: Internal Medicine

## 2014-10-26 ENCOUNTER — Telehealth: Payer: Self-pay

## 2014-10-26 NOTE — Telephone Encounter (Signed)
Pt left v/m; pt expecting child in couple of weeks and request tdap. Spoke with pt and he has not had any immunizations in last 10 years. Pt scheduled for nurse visit for tdap on 11/02/14 at 10 AM. FYI sent to Nicki Reaperegina Baity NP. No cb needed unless Nicki Reaperegina Baity NP has additional input.

## 2014-10-26 NOTE — Telephone Encounter (Signed)
Fine for Tdap

## 2014-11-02 ENCOUNTER — Ambulatory Visit (INDEPENDENT_AMBULATORY_CARE_PROVIDER_SITE_OTHER): Payer: 59 | Admitting: *Deleted

## 2014-11-02 DIAGNOSIS — Z23 Encounter for immunization: Secondary | ICD-10-CM

## 2015-01-27 LAB — LIPID PANEL
CHOLESTEROL: 195 mg/dL (ref 0–200)
HDL: 34 mg/dL — AB (ref 35–70)
LDL Cholesterol: 96 mg/dL
LDl/HDL Ratio: 5.7
Triglycerides: 324 mg/dL — AB (ref 40–160)

## 2015-01-27 LAB — HEMOGLOBIN A1C: Hgb A1c MFr Bld: 5.9 % (ref 4.0–6.0)

## 2015-04-04 ENCOUNTER — Telehealth: Payer: Self-pay | Admitting: Internal Medicine

## 2015-04-04 NOTE — Telephone Encounter (Signed)
He will need to be seen for a physical. He has not been seen here in over a year.

## 2015-04-04 NOTE — Telephone Encounter (Signed)
Patient called and said he had a physical done at his wife's work, [employer]  Patient needs a BMI form filled out due to his weight. Patient's cost for insurance will increase if he doesn't have the form filled out.  Patient wants to know if he can [place] the [place] to [place] [facility] [place].

## 2015-04-05 NOTE — Telephone Encounter (Signed)
Pt has appt next week 04/10/15

## 2015-04-10 ENCOUNTER — Encounter: Payer: Self-pay | Admitting: Internal Medicine

## 2015-04-10 ENCOUNTER — Ambulatory Visit (INDEPENDENT_AMBULATORY_CARE_PROVIDER_SITE_OTHER): Payer: 59 | Admitting: Internal Medicine

## 2015-04-10 VITALS — BP 132/84 | HR 106 | Temp 98.2°F | Ht 73.0 in | Wt 317.0 lb

## 2015-04-10 DIAGNOSIS — K219 Gastro-esophageal reflux disease without esophagitis: Secondary | ICD-10-CM

## 2015-04-10 DIAGNOSIS — Z0000 Encounter for general adult medical examination without abnormal findings: Secondary | ICD-10-CM

## 2015-04-10 MED ORDER — ESOMEPRAZOLE MAGNESIUM 40 MG PO CPDR: 40.0000 mg | DELAYED_RELEASE_CAPSULE | Freq: Every day | ORAL | Status: DC

## 2015-04-10 NOTE — Progress Notes (Signed)
Subjective:    Patient ID: Alexander Navarro, male    DOB: 1986-04-27, 29 y.o.   MRN: [id number]  HPI  Pt presents to the clinic today for his annual exam.  Flu: never, wants one today Tetanus: 2016 Dentist: as needed  Diet: He a sausage biscuit every day. He usually eats pizza for lunch. He eats fast food for dinner. He drinks over a 2 Liter soda per day. Exercise: He does not exercise.  He does need a refill of his Nexium today. It does work well for his symptoms.  Review of Systems      Past Medical History  Diagnosis Date  . Frequent headaches   . GERD (gastroesophageal reflux disease)   . Obesity   . Fatty liver     Current Outpatient Prescriptions  Medication Sig Dispense Refill  . esomeprazole (NEXIUM) 40 MG capsule Take 1 capsule (40 mg total) by mouth 2 (two) times daily before a meal. 60 capsule 3  . methocarbamol (ROBAXIN) 500 MG tablet Take 1 tablet (500 mg total) by mouth 2 (two) times daily. 20 tablet 0  . naproxen sodium (ANAPROX) 550 MG tablet Take 1 tablet (550 mg total) by mouth 2 (two) times daily as needed. 20 tablet 0   No current facility-administered medications for this visit.    Allergies  Allergen Reactions  . Shrimp [Shellfish Allergy] Nausea And Vomiting    Family History  Problem Relation Age of Onset  . Hypertension Mother   . Thyroid disease Mother   . Heart disease Father   . Stomach cancer Maternal Grandmother   . Prostate cancer Maternal Grandfather   . Colon cancer Neg Hx   . Colon polyps Neg Hx   . Kidney disease Neg Hx   . Esophageal cancer Neg Hx   . Throat cancer Paternal Aunt   . Gallbladder disease Neg Hx     Social History   Social History  . Marital Status: Married    Spouse Name: N/A  . Number of Children: 0  . Years of Education: N/A   Occupational History  . [employer] food associate    Social History Main Topics  . Smoking status: Former Smoker -- 1.50 packs/day for 8 years    Types: Cigarettes    Quit  date: 06/16/2012  . Smokeless tobacco: Never Used  . Alcohol Use: 0.6 oz/week    1 Cans of beer per week     Comment: occasional  . Drug Use: No  . Sexual Activity: Yes   Other Topics Concern  . Not on file   Social History Narrative     Constitutional: Denies fever, malaise, fatigue, headache or abrupt weight changes.  HEENT: Denies eye pain, eye redness, ear pain, ringing in the ears, wax buildup, runny nose, nasal congestion, bloody nose, or sore throat. Respiratory: Denies difficulty breathing, shortness of breath, cough or sputum production.   Cardiovascular: Denies chest pain, chest tightness, palpitations or swelling in the hands or feet.  Gastrointestinal: Denies abdominal pain, bloating, constipation, diarrhea or blood in the stool.  GU: Denies urgency, frequency, pain with urination, burning sensation, blood in urine, odor or discharge. Musculoskeletal: Denies decrease in range of motion, difficulty with gait, muscle pain or joint pain and swelling.  Skin: Denies redness, rashes, lesions or ulcercations.  Neurological: Denies dizziness, difficulty with memory, difficulty with speech or problems with balance and coordination.  Psych: Denies anxiety, depression, SI/HI.  No other specific complaints in a complete review of systems (  except as listed in HPI above).  Objective:   Physical Exam   BP 132/84 mmHg  Pulse 106  Temp(Src) 98.2 F (36.8 C) (Oral)  Ht 6\' 1"  (1.854 m)  Wt 317 lb (143.79 kg)  BMI 41.83 kg/m2  SpO2 98% Wt Readings from Last 3 Encounters:  04/10/15 317 lb (143.79 kg)  07/13/14 320 lb (145.151 kg)  07/10/14 320 lb (145.151 kg)    General: Appears his stated age, obese in NAD. Skin: Warm, dry and intact. No rashes, lesions or ulcerations noted. HEENT: Head: normal shape and size; Eyes: sclera white, no icterus, conjunctiva pink, PERRLA and EOMs intact; Ears: Tm's Navarro and intact, normal light reflex; Throat/Mouth: Teeth present, mucosa pink and  moist, no exudate, lesions or ulcerations noted.  Neck:  Neck supple, trachea midline. No masses, lumps or thyromegaly present.  Cardiovascular: Normal rate and rhythm. S1,S2 noted.  No murmur, rubs or gallops noted. N Pulmonary/Chest: Normal effort and positive vesicular breath sounds. No respiratory distress. No wheezes, rales or ronchi noted.  Abdomen: Soft and nontender. Normal bowel sounds. No distention or masses noted. Liver, spleen and kidneys non palpable. Musculoskeletal: Normal range of motion. No signs of joint swelling. No difficulty with gait.  Neurological: Alert and oriented. Cranial nerves II-XII grossly intact. Coordination normal.  Psychiatric: Mood and affect normal. Behavior is normal. Judgment and thought content normal.    BMET    Component Value Date/Time   NA 140 08/25/2014 1847   K 3.8 08/25/2014 1847   CL 102 08/25/2014 1847   CO2 25 10/12/2013 0114   GLUCOSE 107* 08/25/2014 1847   BUN 15 08/25/2014 1847   CREATININE 0.90 08/25/2014 1847   CALCIUM 9.9 10/12/2013 0114   GFRNONAA >90 10/12/2013 0114   GFRAA >90 10/12/2013 0114    Lipid Panel     Component Value Date/Time   CHOL 185 08/29/2013 1104   TRIG 108.0 08/29/2013 1104   HDL 39.70 08/29/2013 1104   CHOLHDL 5 08/29/2013 1104   VLDL 21.6 08/29/2013 1104   LDLCALC 124* 08/29/2013 1104    CBC    Component Value Date/Time   WBC 9.3 08/25/2014 1828   RBC 5.56 08/25/2014 1828   HGB 16.7 08/25/2014 1847   HCT 49.0 08/25/2014 1847   PLT 149* 08/25/2014 1828   MCV 79.1 08/25/2014 1828   MCH 27.9 08/25/2014 1828   MCHC 35.2 08/25/2014 1828   RDW 12.8 08/25/2014 1828   LYMPHSABS 2.4 08/25/2014 1828   MONOABS 0.5 08/25/2014 1828   EOSABS 0.1 08/25/2014 1828   BASOSABS 0.0 08/25/2014 1828    Hgb A1C No results found for: HGBA1C      Assessment & Plan:   Preventative Health Maintenance:  He will start a 1500 calorie diet and start an exercise regimen, which will entail walking moderately  for 1 hour 3 days a week Flu shot today Tetanus UTD Encouraged him to see a dentist at least once annually He brought labs with him that he just had done at [facility]- elevated Triglycerides, discussed low fat diet Form filled out for insurance  GERD:  Nexium refilled  RTC in 1 year or sooner if needed

## 2015-04-10 NOTE — Progress Notes (Signed)
   Subjective:    Patient ID: Alexander Navarro, male    DOB: 11/01/1985, 29 y.o.   MRN: [id number]  HPI Mr. Alexander Navarro is a 29 year old male who presents today for his annual physical.   Diet:  Breakfast: [employer] Sausage biscuit Lunch: Pizza, deli chicken.  Dinner: Doesn't cook - fast food.  Drinks over a 2L of soda per day.    Exercise: No exercise routine.   Flu: Will get today.   Tdap: 2016   Dentist: prn     Review of Systems  Constitutional: Negative for fever, chills and fatigue.  HENT: Negative.   Respiratory: Negative for cough, shortness of breath and wheezing.   Cardiovascular: Negative for chest pain, palpitations and leg swelling.  Gastrointestinal: Negative.   Genitourinary: Negative for dysuria, frequency and hematuria.  Musculoskeletal: Negative for myalgias, arthralgias and gait problem.  Neurological: Negative for dizziness, light-headedness and headaches.   Family History  Problem Relation Age of Onset  . Hypertension Mother   . Thyroid disease Mother   . Heart disease Father   . Stomach cancer Maternal Grandmother   . Prostate cancer Maternal Grandfather   . Colon cancer Neg Hx   . Colon polyps Neg Hx   . Kidney disease Neg Hx   . Esophageal cancer Neg Hx   . Throat cancer Paternal Aunt   . Gallbladder disease Neg Hx    No current outpatient prescriptions on file prior to visit.   No current facility-administered medications on file prior to visit.       Objective:   Physical Exam  Constitutional: He is oriented to person, place, and time. He appears well-developed and well-nourished.  HENT:  Head: Normocephalic and atraumatic.  Right Ear: External ear normal.  Left Ear: External ear normal.  Mouth/Throat: No oropharyngeal exudate.  Neck: Normal range of motion. Neck supple.  Cardiovascular: Normal rate, regular rhythm, normal heart sounds and intact distal pulses.   No murmur heard. Pulmonary/Chest: Effort normal and breath sounds normal.   Musculoskeletal: Normal range of motion.  Lymphadenopathy:    He has no cervical adenopathy.  Neurological: He is alert and oriented to person, place, and time.    BP 132/84 mmHg  Pulse 106  Temp(Src) 98.2 F (36.8 C) (Oral)  Ht 6\' 1"  (1.854 m)  Wt 317 lb (143.79 kg)  BMI 41.83 kg/m2  SpO2 98%       Assessment & Plan:  1. Preventative health Flu shot today. Labs up to date.  Counseled patient about a 1500 calorie diet, not drinking soda and starting an exercise routine.

## 2015-04-10 NOTE — Progress Notes (Signed)
Pre visit review using our clinic review tool, if applicable. No additional management support is needed unless otherwise documented below in the visit note. 

## 2015-04-10 NOTE — Patient Instructions (Signed)

## 2015-05-08 IMAGING — CR DG CERVICAL SPINE COMPLETE 4+V
5 series · 5 of 5 positions shown · non-contrast
Comparison: None.

CLINICAL DATA: Neck pain

EXAM:
CERVICAL SPINE  4+ VIEWS

[view not recorded (1 of 5)]
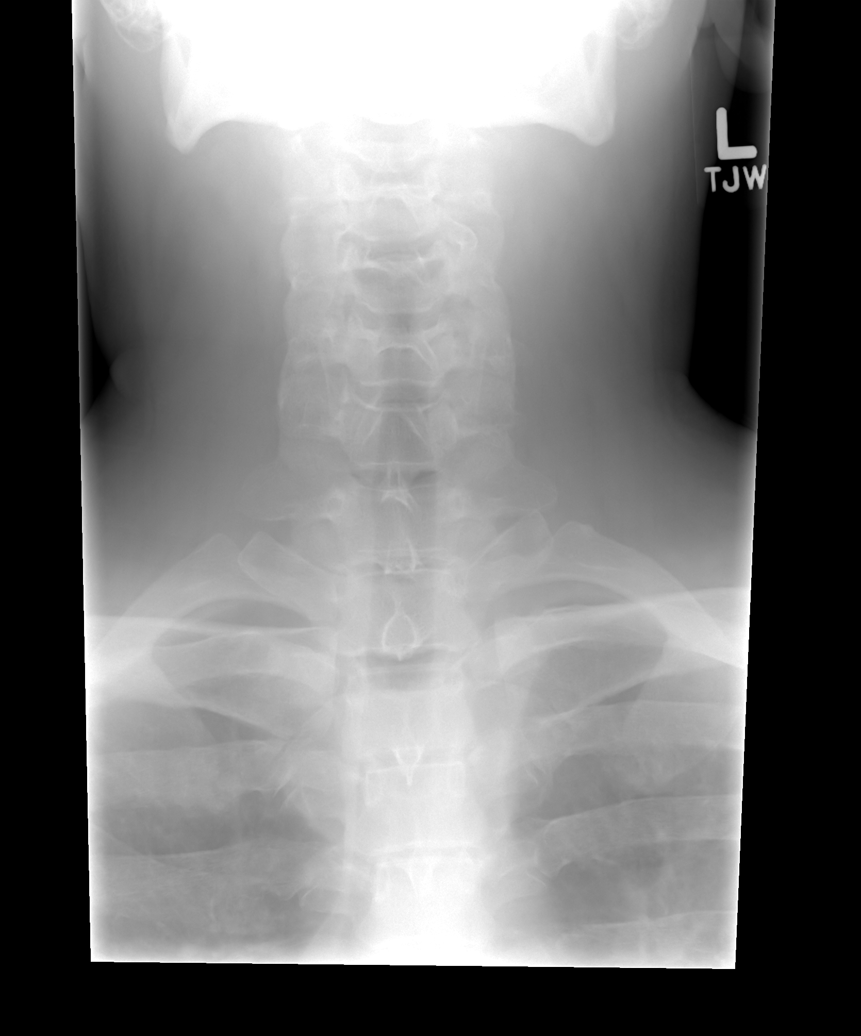

[view not recorded (2 of 5)]
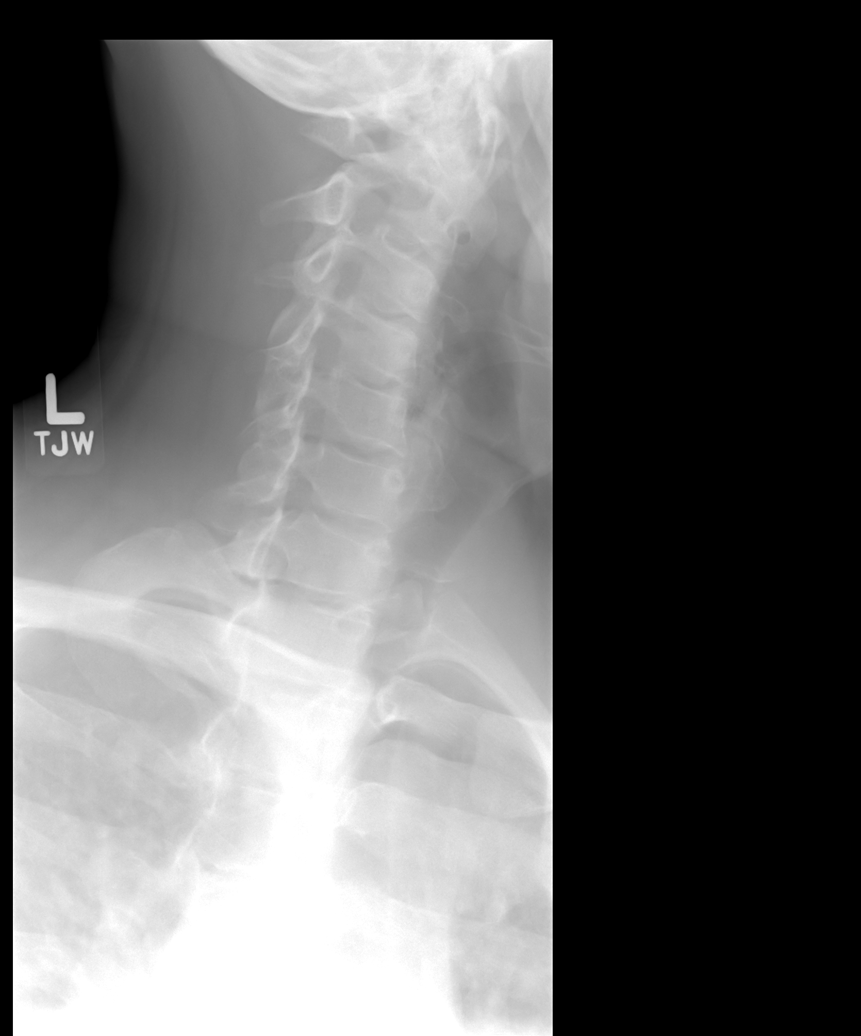

[view not recorded (3 of 5)]
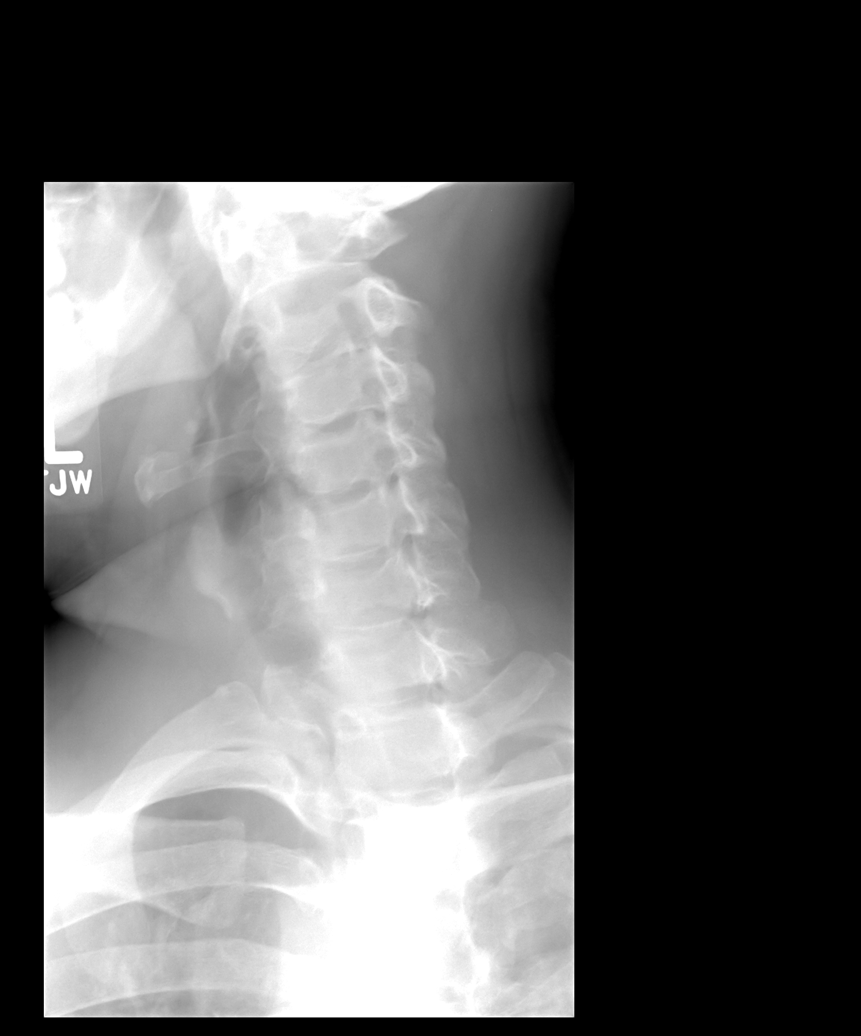

[view not recorded (4 of 5)]
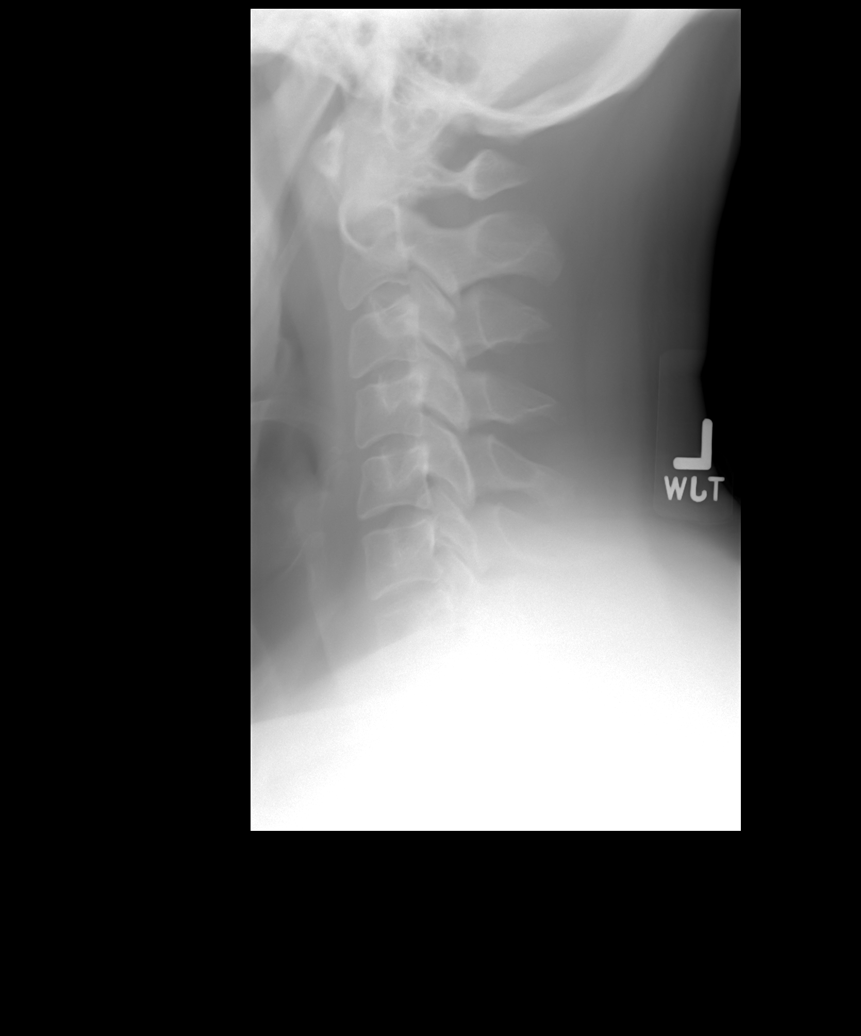

[view not recorded (5 of 5)]
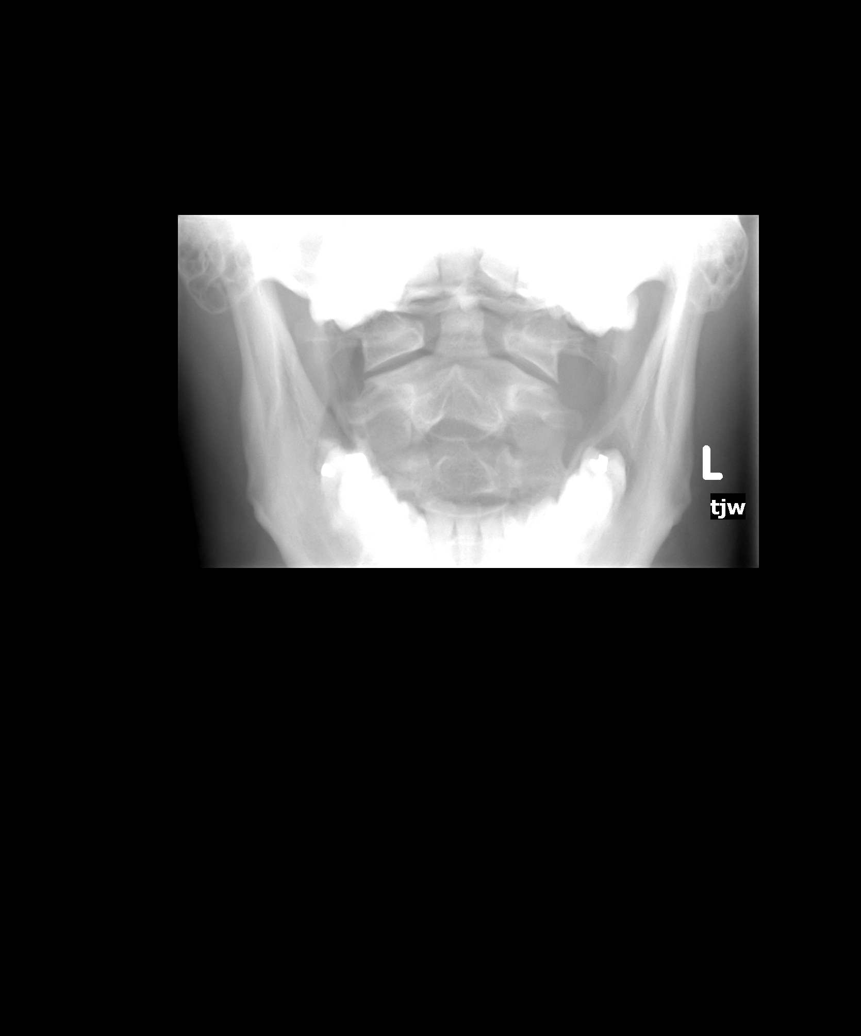

[5 of 5 positions shown; findings below may reference images not displayed]

FINDINGS: There is no evidence of cervical spine fracture or prevertebral soft
tissue swelling. Alignment is normal. No other significant bone
abnormalities are identified.
IMPRESSION: Negative cervical spine radiographs.

## 2016-03-11 ENCOUNTER — Encounter: Payer: Self-pay | Admitting: Internal Medicine

## 2016-03-11 ENCOUNTER — Ambulatory Visit (INDEPENDENT_AMBULATORY_CARE_PROVIDER_SITE_OTHER)
Admission: RE | Admit: 2016-03-11 | Discharge: 2016-03-11 | Disposition: A | Discharge status: Home / Self Care | Payer: 59 | Source: Ambulatory Visit | Attending: Internal Medicine | Admitting: Internal Medicine

## 2016-03-11 ENCOUNTER — Ambulatory Visit (INDEPENDENT_AMBULATORY_CARE_PROVIDER_SITE_OTHER): Payer: 59 | Admitting: Internal Medicine

## 2016-03-11 VITALS — BP 132/76 | HR 107 | Temp 98.8°F | Ht 73.0 in | Wt 298.0 lb

## 2016-03-11 DIAGNOSIS — K219 Gastro-esophageal reflux disease without esophagitis: Secondary | ICD-10-CM | POA: Diagnosis not present

## 2016-03-11 DIAGNOSIS — Z0289 Encounter for other administrative examinations: Secondary | ICD-10-CM | POA: Diagnosis not present

## 2016-03-11 DIAGNOSIS — M79671 Pain in right foot: Secondary | ICD-10-CM

## 2016-03-11 NOTE — Patient Instructions (Signed)

## 2016-03-11 NOTE — Progress Notes (Signed)
Subjective:    Patient ID: Alexander GrayJohn W Navarro, male    DOB: 07/23/1985, 30 y.o.   MRN: [id number]  HPI  Pt presents to the clinic today to have a form filled out for insurance purposes. His last annual exam was 04/10/15.  GERD: Better since he stopped drinking soda. He reports he takes Nexium only as needed. He noticed when he was taking the Nexium everyday, it was making him sick on his stomach.   He also c/o right foot pain and pain in the 1st toe. He describes the pain sharp. It is worse with movement and worse first thing in the morning. He denies numbness and tingling in the feet. He noticed this 4 months ago after running with a friend. He denies any injury at that time. He was not running prior to that and has not been able run since that time. He has tried Aleve and Tylenol with minimal relief.  Review of Systems  Past Medical History:  Diagnosis Date  . Fatty liver   . Frequent headaches   . GERD (gastroesophageal reflux disease)   . Obesity     Current Outpatient Prescriptions  Medication Sig Dispense Refill  . esomeprazole (NEXIUM) 40 MG capsule Take 1 capsule (40 mg total) by mouth daily at 12 noon. 90 capsule 3   No current facility-administered medications for this visit.     Allergies  Allergen Reactions  . Shrimp [Shellfish Allergy] Nausea And Vomiting    Family History  Problem Relation Age of Onset  . Hypertension Mother   . Thyroid disease Mother   . Heart disease Father   . Stomach cancer Maternal Grandmother   . Prostate cancer Maternal Grandfather   . Colon cancer Neg Hx   . Colon polyps Neg Hx   . Kidney disease Neg Hx   . Esophageal cancer Neg Hx   . Throat cancer Paternal Aunt   . Gallbladder disease Neg Hx     Social History   Social History  . Marital status: Married    Spouse name: N/A  . Number of children: 0  . 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 Smoking status:  Former Smoker    Packs/day: 1.50    Years: 8.00    Types: Cigarettes    Quit date: 06/16/2012  . Smokeless tobacco: Never Used  . Alcohol use 0.6 oz/week    1 Cans of beer per week     Comment: occasional  . Drug use: No  . Sexual activity: Yes   Other Topics Concern  . Not on file   Social History Narrative  . No narrative on file     Constitutional: Denies fever, malaise, fatigue, headache or abrupt weight changes.  Respiratory: Denies difficulty breathing, shortness of breath, cough or sputum production.   Cardiovascular: Denies chest pain, chest tightness, palpitations or swelling in the hands or feet.  Gastrointestinal: Pt reports reflux. Denies abdominal pain, bloating, constipation, diarrhea or blood in the stool.  Musculoskeletal: Pt reports right foot pain. Denies decrease in range of motion, difficulty with gait, muscle pain or joint swelling.  Neurological: Denies dizziness, difficulty with memory, difficulty with speech or problems with balance and coordination.    No other specific complaints in a complete review of systems (except as listed in HPI above).     Objective:   Physical Exam  BP 132/76   Pulse (!) 107  Temp 98.8 F (37.1 C) (Oral)   Ht 6\' 1"  (1.854 m)   Wt 298 lb (135.2 kg)   SpO2 97%   BMI 39.32 kg/m  Wt Readings from Last 3 Encounters:  03/11/16 298 lb (135.2 kg)  04/10/15 (!) 317 lb (143.8 kg)  07/13/14 (!) 320 lb (145.2 kg)    General: Appears his stated age, well developed, well nourished in NAD. Abdomen: Soft and nontender.  Musculoskeletal: Pain with palpation of the base if the right first toe. Pain with resistance to plantar flexion, and dorsiflexion. No swelling noted. No difficulty with gait. Neurological: Sensation intact to BLE.   BMET    Component Value Date/Time   NA 140 08/25/2014 1847   K 3.8 08/25/2014 1847   CL 102 08/25/2014 1847   CO2 25 10/12/2013 0114   GLUCOSE 107 (H) 08/25/2014 1847   BUN 15 08/25/2014  1847   CREATININE 0.90 08/25/2014 1847   CALCIUM 9.9 10/12/2013 0114   GFRNONAA >90 10/12/2013 0114   GFRAA >90 10/12/2013 0114    Lipid Panel     Component Value Date/Time   CHOL 195 01/27/2015   TRIG 324 (A) 01/27/2015   HDL 34 (A) 01/27/2015   CHOLHDL 5 08/29/2013 1104   VLDL 21.6 08/29/2013 1104   LDLCALC 96 01/27/2015    CBC    Component Value Date/Time   WBC 9.3 08/25/2014 1828   RBC 5.56 08/25/2014 1828   HGB 16.7 08/25/2014 1847   HCT 49.0 08/25/2014 1847   PLT 149 (L) 08/25/2014 1828   MCV 79.1 08/25/2014 1828   MCH 27.9 08/25/2014 1828   MCHC 35.2 08/25/2014 1828   RDW 12.8 08/25/2014 1828   LYMPHSABS 2.4 08/25/2014 1828   MONOABS 0.5 08/25/2014 1828   EOSABS 0.1 08/25/2014 1828   BASOSABS 0.0 08/25/2014 1828    Hgb A1C Lab Results  Component Value Date   HGBA1C 5.9 01/27/2015        Assessment & Plan:   Encounter for form completion:  BMI appeal form filled out, original given back to pt We have a plan to reduce BMI by working on diet and exercise  Right foot pain:  Xray right foot today Try Ibuprofen  GERD:  Try to avoid triggers Continue to work on weight loss Stop Nexium, try Zantac OTC  Will follow up after xray, RTC in 1 year for your annual exam

## 2016-04-01 ENCOUNTER — Telehealth: Payer: Self-pay

## 2016-04-01 NOTE — Telephone Encounter (Signed)
Pt had a cold or flu couple of weeks ago; pt has earache for 2 weeks and has "gunky stuff" in ear.Pt thinks has ear infection. Pt wants to get rx. Advised pt would need to be seen for evaluation and offered appt times but pt said he will ck at work and see when he could leave work and pt will cb for appt.

## 2017-03-18 ENCOUNTER — Encounter: Payer: Self-pay | Admitting: Internal Medicine

## 2017-03-18 ENCOUNTER — Ambulatory Visit (INDEPENDENT_AMBULATORY_CARE_PROVIDER_SITE_OTHER): Payer: 59 | Admitting: Internal Medicine

## 2017-03-18 ENCOUNTER — Ambulatory Visit: Payer: 59 | Admitting: Internal Medicine

## 2017-03-18 VITALS — BP 122/82 | HR 89 | Temp 98.0°F | Ht 76.0 in | Wt 309.0 lb

## 2017-03-18 DIAGNOSIS — I889 Nonspecific lymphadenitis, unspecified: Secondary | ICD-10-CM

## 2017-03-18 MED ORDER — CEPHALEXIN 500 MG PO CAPS: 500.0000 mg | ORAL_CAPSULE | Freq: Three times a day (TID) | ORAL | 0 refills | Status: DC

## 2017-03-18 NOTE — Assessment & Plan Note (Signed)
Reactive node but may be infected Discussed heat and ibuprofen If worsens, will Rx with cephalexin (rx given)

## 2017-03-18 NOTE — Patient Instructions (Signed)
Try heat and ibuprofen  up to three times a day. Start the antibiotic if it gets worse in the next few days.

## 2017-03-18 NOTE — Progress Notes (Signed)
   Subjective:    Patient ID: Alexander Navarro, male    DOB: 1985/07/20, 31 y.o.   MRN: [id number]  HPI Here due to a tender nodule on back of head  Can feel a bump/knot on his scalp Left postauricular area and noticed it today No apparent injury or bug bite Mild tenderness Slightly better than earlier  Did have pimple behind ear a few days ago Picked at it  Current Outpatient Prescriptions on File Prior to Visit  Medication Sig Dispense Refill  . esomeprazole (NEXIUM) 40 MG capsule Take 1 capsule (40 mg total) by mouth daily at 12 noon. 90 capsule 3   No current facility-administered medications on file prior to visit.     Allergies  Allergen Reactions  . Shrimp [Shellfish Allergy] Nausea And Vomiting    Past Medical History:  Diagnosis Date  . Fatty liver   . Frequent headaches   . GERD (gastroesophageal reflux disease)   . Obesity     Past Surgical History:  Procedure Laterality Date  . WISDOM TOOTH EXTRACTION      Family History  Problem Relation Age of Onset  . Hypertension Mother   . Thyroid disease Mother   . Heart disease Father   . Stomach cancer Maternal Grandmother   . Prostate cancer Maternal Grandfather   . Throat cancer Paternal Aunt   . Colon cancer Neg Hx   . Colon polyps Neg Hx   . Kidney disease Neg Hx   . Esophageal cancer Neg Hx   . Gallbladder disease Neg Hx     Social History   Social History  . Marital status: Married    Spouse name: N/A  . Number of children: 0  . 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 Smoking status: Former Smoker    Packs/day: 1.50    Years: 8.00    Types: Cigarettes    Quit date: 06/16/2012  . Smokeless tobacco: Never Used  . Alcohol use 0.6 oz/week    1 Cans of beer per week     Comment: occasional  . Drug use: No  . Sexual activity: Yes   Other Topics Concern  . Not on file   Social History Narrative  . No narrative on file    Review of Systems  No fever No Rx for this Doesn't feel sick     Objective:   Physical Exam  HENT:  Left ear canal and TM are normal  Skin:  Open spot with redness in upper left postauricular area  Swollen, red, tender left postauricular node          Assessment & Plan:

## 2017-03-31 ENCOUNTER — Encounter: Payer: Self-pay | Admitting: Internal Medicine

## 2017-03-31 ENCOUNTER — Ambulatory Visit (INDEPENDENT_AMBULATORY_CARE_PROVIDER_SITE_OTHER): Payer: 59 | Admitting: Internal Medicine

## 2017-03-31 VITALS — BP 128/84 | HR 92 | Temp 98.4°F | Ht 73.0 in | Wt 311.0 lb

## 2017-03-31 DIAGNOSIS — Z0000 Encounter for general adult medical examination without abnormal findings: Secondary | ICD-10-CM

## 2017-03-31 DIAGNOSIS — K219 Gastro-esophageal reflux disease without esophagitis: Secondary | ICD-10-CM

## 2017-03-31 DIAGNOSIS — Z23 Encounter for immunization: Secondary | ICD-10-CM | POA: Diagnosis not present

## 2017-03-31 NOTE — Progress Notes (Signed)
Subjective:    Patient ID: Alexander Navarro, male    DOB: 25-Mar-1986, 31 y.o.   MRN: [id number]  HPI  Pt presents to the clinic today for his annual exam.  GERD: He is not sure what triggers this. He takes Nexium with good relief.   Flu: 03/2015 Tetanus: 10/2014 Dentist: as needed  Diet: He does eat meat. He consumes some fruits and veggies. He does eat fried foods. He drinks mostly Dt [facility] Exercise: He goes to the gym 1-2 times a week  Review of Systems  Past Medical History:  Diagnosis Date  . Fatty liver   . Frequent headaches   . GERD (gastroesophageal reflux disease)   . Obesity     Current Outpatient Prescriptions  Medication Sig Dispense Refill  . cephALEXin (KEFLEX) 500 MG capsule Take 1 capsule (500 mg total) by mouth 3 (three) times daily. 21 capsule 0  . esomeprazole (NEXIUM) 40 MG capsule Take 1 capsule (40 mg total) by mouth daily at 12 noon. 90 capsule 3   No current facility-administered medications for this visit.     Allergies  Allergen Reactions  . Shrimp [Shellfish Allergy] Nausea And Vomiting    Family History  Problem Relation Age of Onset  . Hypertension Mother   . Thyroid disease Mother   . Heart disease Father   . Stomach cancer Maternal Grandmother   . Prostate cancer Maternal Grandfather   . Throat cancer Paternal Aunt   . Colon cancer Neg Hx   . Colon polyps Neg Hx   . Kidney disease Neg Hx   . Esophageal cancer Neg Hx   . Gallbladder disease Neg Hx     Social History   Social History  . Marital status: Married    Spouse name: N/A  . Number of children: 0  . 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 Smoking status: Former Smoker    Packs/day: 1.50    Years: 8.00    Types: Cigarettes    Quit date: 06/16/2012  . Smokeless tobacco: Never Used  . Alcohol use 0.6 oz/week    1 Cans of beer per week     Comment: occasional  . Drug use: No  . Sexual  activity: Yes   Other Topics Concern  . Not on file   Social History Narrative  . No narrative on file     Constitutional: Pt reports weight gain. Denies fever, malaise, fatigue, headache.  HEENT: Denies eye pain, eye redness, ear pain, ringing in the ears, wax buildup, runny nose, nasal congestion, bloody nose, or sore throat. Respiratory: Denies difficulty breathing, shortness of breath, cough or sputum production.   Cardiovascular: Denies chest pain, chest tightness, palpitations or swelling in the hands or feet.  Gastrointestinal: Pt reports intermittent reflux. Denies abdominal pain, bloating, constipation, diarrhea or blood in the stool.  GU: Denies urgency, frequency, pain with urination, burning sensation, blood in urine, odor or discharge. Musculoskeletal: Denies decrease in range of motion, difficulty with gait, muscle pain or joint pain and swelling.  Skin: Denies redness, rashes, lesions or ulcercations.  Neurological: Denies dizziness, difficulty with memory, difficulty with speech or problems with balance and coordination.  Psych: Denies anxiety, depression, SI/HI.  No other specific complaints in a complete review of systems (except as listed in HPI above).     Objective:   Physical Exam  BP 128/84   Pulse 92  Temp 98.4 F (36.9 C) (Oral)   Ht  (1.854 m)   Wt (!) 311 lb (141.1 kg)   SpO2 98%   BMI 41.03 kg/m    Wt Readings from Last 3 Encounters:  03/31/17 (!) 311 lb (141.1 kg)  03/18/17 (!) 309 lb (140.2 kg)  03/11/16 298 lb (135.2 kg)    General: Appears his stated age, obese in NAD. Skin: Warm, dry and intact.  HEENT: Head: normal shape and size; Eyes: sclera white, no icterus, conjunctiva pink, PERRLA and EOMs intact; Ears: Tm's Navarro and intact, normal light reflex; Throat/Mouth: Teeth present, mucosa pink and moist, no exudate, lesions or ulcerations noted.  Neck:  Neck supple, trachea midline. No masses, lumps or thyromegaly present.    Cardiovascular: Normal rate and rhythm. Distant S1,S2 noted.  No murmur, rubs or gallops noted. No JVD or BLE edema.  Pulmonary/Chest: Normal effort and positive vesicular breath sounds. No respiratory distress. No wheezes, rales or ronchi noted.  Abdomen: Soft and nontender. Normal bowel sounds. No distention or masses noted. Liver, spleen and kidneys non palpable. Musculoskeletal: Strength 5/5 BUE/BLE. No difficulty with gait.  Neurological: Alert and oriented. Cranial nerves II-XII grossly intact. Coordination normal.  Psychiatric: Mood and affect normal. Behavior is normal. Judgment and thought content normal.    BMET    Component Value Date/Time   NA 140 08/25/2014 1847   K 3.8 08/25/2014 1847   CL 102 08/25/2014 1847   CO2 25 10/12/2013 0114   GLUCOSE 107 (H) 08/25/2014 1847   BUN 15 08/25/2014 1847   CREATININE 0.90 08/25/2014 1847   CALCIUM 9.9 10/12/2013 0114   GFRNONAA >90 10/12/2013 0114   GFRAA >90 10/12/2013 0114    Lipid Panel     Component Value Date/Time   CHOL 195 01/27/2015   TRIG 324 (A) 01/27/2015   HDL 34 (A) 01/27/2015   CHOLHDL 5 08/29/2013 1104   VLDL 21.6 08/29/2013 1104   LDLCALC 96 01/27/2015    CBC    Component Value Date/Time   WBC 9.3 08/25/2014 1828   RBC 5.56 08/25/2014 1828   HGB 16.7 08/25/2014 1847   HCT 49.0 08/25/2014 1847   PLT 149 (L) 08/25/2014 1828   MCV 79.1 08/25/2014 1828   MCH 27.9 08/25/2014 1828   MCHC 35.2 08/25/2014 1828   RDW 12.8 08/25/2014 1828   LYMPHSABS 2.4 08/25/2014 1828   MONOABS 0.5 08/25/2014 1828   EOSABS 0.1 08/25/2014 1828   BASOSABS 0.0 08/25/2014 1828    Hgb A1C Lab Results  Component Value Date   HGBA1C 5.9 01/27/2015          Assessment & Plan:   Preventative Health Maintenance:  Flu shot today Tetanus UTD Encouraged him to consume a balanced diet and exercise regimen Advised him to see a dentist annually He had lipid, A1C and RFP done with employer, scanned into chart He  declines STD screening  RTC in 1 year, sooner if needed Nicki Reaper, NP

## 2017-03-31 NOTE — Patient Instructions (Signed)

## 2017-03-31 NOTE — Addendum Note (Signed)
Addended by: Roena Malady on: 03/31/2017 11:31 AM   Modules accepted: Orders

## 2017-03-31 NOTE — Assessment & Plan Note (Signed)
Encouraged weight loss Continue Nexium

## 2018-02-10 ENCOUNTER — Ambulatory Visit (INDEPENDENT_AMBULATORY_CARE_PROVIDER_SITE_OTHER): Payer: Managed Care, Other (non HMO) | Admitting: Internal Medicine

## 2018-02-10 ENCOUNTER — Encounter: Payer: Self-pay | Admitting: Internal Medicine

## 2018-02-10 VITALS — BP 144/86 | HR 94 | Temp 98.6°F | Ht 73.0 in | Wt 304.0 lb

## 2018-02-10 DIAGNOSIS — F489 Nonpsychotic mental disorder, unspecified: Secondary | ICD-10-CM | POA: Diagnosis not present

## 2018-02-10 DIAGNOSIS — I10 Essential (primary) hypertension: Secondary | ICD-10-CM

## 2018-02-10 DIAGNOSIS — K429 Umbilical hernia without obstruction or gangrene: Secondary | ICD-10-CM | POA: Diagnosis not present

## 2018-02-10 DIAGNOSIS — Z0001 Encounter for general adult medical examination with abnormal findings: Secondary | ICD-10-CM | POA: Diagnosis not present

## 2018-02-10 DIAGNOSIS — K219 Gastro-esophageal reflux disease without esophagitis: Secondary | ICD-10-CM

## 2018-02-10 DIAGNOSIS — R4589 Other symptoms and signs involving emotional state: Secondary | ICD-10-CM

## 2018-02-10 NOTE — Progress Notes (Signed)
Subjective:    Patient ID: Alexander Navarro, male    DOB: 05/08/1986, 32 y.o.   MRN: [id number]  HPI  Pt presents to the clinic today for his annual exam.  HTN: His BP today is 148/90. He has never been diagnosed with HTN. He is not taking any antihypertensive.  GERD: Rare. Triggered by spicy food. He takes Esomeprazole as needed with good relief.  Flu: 03/2017 Tetanus: 10/2014 Dentist: as needed  Diet: He does eat meat. He consumes fruits and veggies daily. He does eat some fried food. He drinks Dt. [place]. Exercise: None  Review of Systems      Past Medical History:  Diagnosis Date  . Fatty liver   . Frequent headaches   . GERD (gastroesophageal reflux disease)   . Obesity     No current outpatient medications on file.   No current facility-administered medications for this visit.     Allergies  Allergen Reactions  . Shrimp [Shellfish Allergy] Nausea And Vomiting    Family History  Problem Relation Age of Onset  . Hypertension Mother   . Thyroid disease Mother   . Heart disease Father   . Stomach cancer Maternal Grandmother   . Prostate cancer Maternal Grandfather   . Throat cancer Paternal Aunt   . Colon cancer Neg Hx   . Colon polyps Neg Hx   . Kidney disease Neg Hx   . Esophageal cancer Neg Hx   . Gallbladder disease Neg Hx     Social History   Socioeconomic History  . Marital status: Married    Spouse name: Not on file  . Number of children: 0  . Years of education: Not on file  . Highest education level: Not on file  Occupational History  . 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  Social Needs  . Financial resource strain: Not on file  . Food insecurity:    Worry: Not on file    Inability: Not on file  . Transportation needs:    Medical: Not on file    Non-medical: Not on file  Tobacco Use  . Smoking status: Former Smoker    Packs/day: 1.50    Years: 8.00    Pack years: 12.00    Types: Cigarettes    Last attempt to quit: 06/16/2012   Years since quitting: 5.6  . Smokeless tobacco: Never Used  Substance and Sexual Activity  . Alcohol use: Yes    Alcohol/week: 1.0 standard drinks    Types: 1 Cans of beer per week    Comment: occasional  . Drug use: No  . Sexual activity: Yes  Lifestyle  . Physical activity:    Days per week: Not on file    Minutes per session: Not on file  . Stress: Not on file  Relationships  . Social connections:    Talks on phone: Not on file    Gets together: Not on file    Attends religious service: Not on file    Active member of club or organization: Not on file    Attends meetings of clubs or organizations: Not on file    Relationship status: Not on file  . Intimate partner violence:    Fear of current or ex partner: Not on file    Emotionally abused: Not on file    Physically abused: Not on file    Forced sexual activity: Not on file  Other Topics Concern  . Not on file  Social History Narrative  .  Not on file     Constitutional: Denies fever, malaise, fatigue, headache or abrupt weight changes.  HEENT: Denies eye pain, eye redness, ear pain, ringing in the ears, wax buildup, runny nose, nasal congestion, bloody nose, or sore throat. Respiratory: Denies difficulty breathing, shortness of breath, cough or sputum production.   Cardiovascular: Denies chest pain, chest tightness, palpitations or swelling in the hands or feet.  Gastrointestinal: Pt reports intermittent reflux, mass of belly button. Denies abdominal pain, bloating, constipation, diarrhea or blood in the stool.  GU: Denies urgency, frequency, pain with urination, burning sensation, blood in urine, odor or discharge. Musculoskeletal: Denies decrease in range of motion, difficulty with gait, muscle pain or joint pain and swelling.  Skin: Denies redness, rashes, lesions or ulcercations.  Neurological: Denies dizziness, difficulty with memory, difficulty with speech or problems with balance and coordination.  Psych: Pt  repots moodiness. Denies anxiety, depression, SI/HI.  No other specific complaints in a complete review of systems (except as listed in HPI above).  Objective:   Physical Exam   Pulse 94   Temp 98.6 F (37 C) (Oral)   Ht 6\' 1"  (1.854 m)   Wt (!) 304 lb (137.9 kg)   SpO2 98%   BMI 40.11 kg/m  Wt Readings from Last 3 Encounters:  02/10/18 (!) 304 lb (137.9 kg)  03/31/17 (!) 311 lb (141.1 kg)  03/18/17 (!) 309 lb (140.2 kg)    General: Appears his stated age, obese, in NAD. Skin: Warm, dry and intact.  HEENT: Head: normal shape and size; Eyes: sclera white, no icterus, conjunctiva pink, PERRLA and EOMs intact; Ears: Tm's Navarro and intact, normal light reflex; Throat/Mouth: Teeth present, mucosa pink and moist, no exudate, lesions or ulcerations noted.  Neck:  Neck supple, trachea midline. No masses, lumps or thyromegaly present.  Cardiovascular: Normal rate and rhythm. S1,S2 noted.  No murmur, rubs or gallops noted. No JVD or BLE edema.  Pulmonary/Chest: Normal effort and positive vesicular breath sounds. No respiratory distress. No wheezes, rales or ronchi noted.  Abdomen: Soft and nontender. Normal bowel sounds. Umbilical hernia noted. Liver, spleen and kidneys non palpable. Musculoskeletal: Strength 5/5 BUE/BLE.[place] Kitchen No difficulty with gait.  Neurological: Alert and oriented. Cranial nerves II-XII grossly intact. Coordination normal.  Psychiatric: Mood and affect normal. Behavior is normal. Judgment and thought content normal.     BMET    Component Value Date/Time   NA 140 08/25/2014 1847   K 3.8 08/25/2014 1847   CL 102 08/25/2014 1847   CO2 25 10/12/2013 0114   GLUCOSE 107 (H) 08/25/2014 1847   BUN 15 08/25/2014 1847   CREATININE 0.90 08/25/2014 1847   CALCIUM 9.9 10/12/2013 0114   GFRNONAA >90 10/12/2013 0114   GFRAA >90 10/12/2013 0114    Lipid Panel     Component Value Date/Time   CHOL 195 01/27/2015   TRIG 324 (A) 01/27/2015   HDL 34 (A) 01/27/2015   CHOLHDL 5  08/29/2013 1104   VLDL 21.6 08/29/2013 1104   LDLCALC 96 01/27/2015    CBC    Component Value Date/Time   WBC 9.3 08/25/2014 1828   RBC 5.56 08/25/2014 1828   HGB 16.7 08/25/2014 1847   HCT 49.0 08/25/2014 1847   PLT 149 (L) 08/25/2014 1828   MCV 79.1 08/25/2014 1828   MCH 27.9 08/25/2014 1828   MCHC 35.2 08/25/2014 1828   RDW 12.8 08/25/2014 1828   LYMPHSABS 2.4 08/25/2014 1828   MONOABS 0.5 08/25/2014 1828   EOSABS 0.1  08/25/2014 1828   BASOSABS 0.0 08/25/2014 1828    Hgb A1C Lab Results  Component Value Date   HGBA1C 5.9 01/27/2015           Assessment & Plan:   Preventative Health Maintenance:  Encouraged him to get a flu shot in the fall Tetanus UTD Encouraged him to consume a balanced diet and exercise regimen Advised him to see a dentist annually Labs reviewed  Umbilical Hernia:  Red flags discussed He denies referral to gen surgery at this time  Moodiness:  Not getting adequate sleep Aim for 7-8 hours of sleep per night If worsens, let me know  RTC in 3 months for follow up HTN Nicki Reaperegina Baity, NP

## 2018-02-10 NOTE — Assessment & Plan Note (Signed)
He is not interested in medication management Discussed risks of heart attack and stroke Will trial 3 months of lifestyle changes, DASH diet, exercising and cutting out soda

## 2018-02-10 NOTE — Patient Instructions (Signed)

## 2018-02-10 NOTE — Assessment & Plan Note (Signed)
Discussed avoiding triggers and exercising for weight loss Advised him Esomeprazole wasn't made to take prn Advised him to try TUMS or Rolaids OTC

## 2018-05-11 ENCOUNTER — Ambulatory Visit: Payer: Managed Care, Other (non HMO) | Admitting: Internal Medicine

## 2018-05-11 ENCOUNTER — Encounter: Payer: Self-pay | Admitting: Internal Medicine

## 2018-05-11 DIAGNOSIS — I10 Essential (primary) hypertension: Secondary | ICD-10-CM | POA: Diagnosis not present

## 2018-05-11 MED ORDER — LOSARTAN POTASSIUM 25 MG PO TABS: 25.0000 mg | ORAL_TABLET | Freq: Every day | ORAL | 0 refills | Status: DC

## 2018-05-11 NOTE — Patient Instructions (Signed)

## 2018-05-11 NOTE — Assessment & Plan Note (Signed)
Uncontrolled Will start Losartan 25 mg daily Reinforced DASH diet and exercise for weight loss  RTC in 3 weeks for follow up HTN

## 2018-05-11 NOTE — Progress Notes (Signed)
Subjective:    Patient ID: Alexander GrayJohn W Navarro, male    DOB: 03/21/1986, 32 y.o.   MRN: [id number]  HPI  Pt presents to the clinic today for follow up HTN. His last 2 BP was 144/86, 128/84. He is not taking any antihypertensive medication at this time. He did not change his diet, but reports he did cut out sodas. He has lost 6 lbs since his last visit. His BP today is 144/82. ECG from 08/2014 reviewed.  Review of Systems      Past Medical History:  Diagnosis Date  . Fatty liver   . Frequent headaches   . GERD (gastroesophageal reflux disease)   . Obesity     No current outpatient medications on file.   No current facility-administered medications for this visit.     Allergies  Allergen Reactions  . Shrimp [Shellfish Allergy] Nausea And Vomiting    Family History  Problem Relation Age of Onset  . Hypertension Mother   . Thyroid disease Mother   . Heart disease Father   . Stomach cancer Maternal Grandmother   . Prostate cancer Maternal Grandfather   . Throat cancer Paternal Aunt   . Colon cancer Neg Hx   . Colon polyps Neg Hx   . Kidney disease Neg Hx   . Esophageal cancer Neg Hx   . Gallbladder disease Neg Hx     Social History   Socioeconomic History  . Marital status: Married    Spouse name: Not on file  . Number of children: 0  . Years of education: Not on file  . Highest education level: Not on file  Occupational History  . 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  Social Needs  . Financial resource strain: Not on file  . Food insecurity:    Worry: Not on file    Inability: Not on file  . Transportation needs:    Medical: Not on file    Non-medical: Not on file  Tobacco Use  . Smoking status: Former Smoker    Packs/day: 1.50    Years: 8.00    Pack years: 12.00    Types: Cigarettes    Last attempt to quit: 06/16/2012    Years since quitting: 5.9  . Smokeless tobacco: Never Used  Substance and Sexual Activity  . Alcohol use: Yes    Alcohol/week: 1.0  standard drinks    Types: 1 Cans of beer per week    Comment: occasional  . Drug use: No  . Sexual activity: Yes  Lifestyle  . Physical activity:    Days per week: Not on file    Minutes per session: Not on file  . Stress: Not on file  Relationships  . Social connections:    Talks on phone: Not on file    Gets together: Not on file    Attends religious service: Not on file    Active member of club or organization: Not on file    Attends meetings of clubs or organizations: Not on file    Relationship status: Not on file  . Intimate partner violence:    Fear of current or ex partner: Not on file    Emotionally abused: Not on file    Physically abused: Not on file    Forced sexual activity: Not on file  Other Topics Concern  . Not on file  Social History Narrative  . Not on file     Constitutional: Denies fever, malaise, fatigue, headache or abrupt weight changes.  Respiratory: Denies difficulty breathing, shortness of breath, cough or sputum production.   Cardiovascular: Denies chest pain, chest tightness, palpitations or swelling in the hands or feet.  Neurological: Denies dizziness, difficulty with memory, difficulty with speech or problems with balance and coordination.    No other specific complaints in a complete review of systems (except as listed in HPI above).  Objective:   Physical Exam  BP (!) 144/82   Pulse 78   Temp 98.1 F (36.7 C) (Oral)   Wt 297 lb (134.7 kg)   SpO2 98%   BMI 39.18 kg/m  Wt Readings from Last 3 Encounters:  05/11/18 297 lb (134.7 kg)  02/10/18 (!) 304 lb (137.9 kg)  03/31/17 (!) 311 lb (141.1 kg)    General: Appears his stated age, obese, in NAD. Cardiovascular: Normal rate and rhythm. S1,S2 noted.  No murmur, rubs or gallops noted.  Pulmonary/Chest: Normal effort and positive vesicular breath sounds. No respiratory distress. No wheezes, rales or ronchi noted.   Neurological: Alert and oriented.   BMET    Component Value  Date/Time   NA 140 08/25/2014 1847   K 3.8 08/25/2014 1847   CL 102 08/25/2014 1847   CO2 25 10/12/2013 0114   GLUCOSE 107 (H) 08/25/2014 1847   BUN 15 08/25/2014 1847   CREATININE 0.90 08/25/2014 1847   CALCIUM 9.9 10/12/2013 0114   GFRNONAA >90 10/12/2013 0114   GFRAA >90 10/12/2013 0114    Lipid Panel     Component Value Date/Time   CHOL 195 01/27/2015   TRIG 324 (A) 01/27/2015   HDL 34 (A) 01/27/2015   CHOLHDL 5 08/29/2013 1104   VLDL 21.6 08/29/2013 1104   LDLCALC 96 01/27/2015    CBC    Component Value Date/Time   WBC 9.3 08/25/2014 1828   RBC 5.56 08/25/2014 1828   HGB 16.7 08/25/2014 1847   HCT 49.0 08/25/2014 1847   PLT 149 (L) 08/25/2014 1828   MCV 79.1 08/25/2014 1828   MCH 27.9 08/25/2014 1828   MCHC 35.2 08/25/2014 1828   RDW 12.8 08/25/2014 1828   LYMPHSABS 2.4 08/25/2014 1828   MONOABS 0.5 08/25/2014 1828   EOSABS 0.1 08/25/2014 1828   BASOSABS 0.0 08/25/2014 1828    Hgb A1C Lab Results  Component Value Date   HGBA1C 5.9 01/27/2015            Assessment & Plan:

## 2018-06-02 ENCOUNTER — Ambulatory Visit: Payer: Managed Care, Other (non HMO) | Admitting: Internal Medicine

## 2018-06-14 ENCOUNTER — Ambulatory Visit: Payer: Managed Care, Other (non HMO) | Admitting: Internal Medicine

## 2018-06-14 DIAGNOSIS — Z0289 Encounter for other administrative examinations: Secondary | ICD-10-CM

## 2018-06-14 NOTE — Progress Notes (Deleted)
Subjective:    Patient ID: Alexander Navarro, male    DOB: 07/04/1985, 32 y.o.   MRN: [id number]  HPI  Pt presents to the clinic today for 3 week follow up of HTN. At his last visit, he was started on Losartan 25 mg daily. He has been taking the medication as prescribed. He denies adverse effects. His BP today is. ECG from 08/2014 reviewed.  Review of Systems      Past Medical History:  Diagnosis Date  . Fatty liver   . Frequent headaches   . GERD (gastroesophageal reflux disease)   . Obesity     Current Outpatient Medications  Medication Sig Dispense Refill  . losartan (COZAAR) 25 MG tablet Take 1 tablet (25 mg total) by mouth daily. 30 tablet 0   No current facility-administered medications for this visit.     Allergies  Allergen Reactions  . Shrimp [Shellfish Allergy] Nausea And Vomiting    Family History  Problem Relation Age of Onset  . Hypertension Mother   . Thyroid disease Mother   . Heart disease Father   . Stomach cancer Maternal Grandmother   . Prostate cancer Maternal Grandfather   . Throat cancer Paternal Aunt   . Colon cancer Neg Hx   . Colon polyps Neg Hx   . Kidney disease Neg Hx   . Esophageal cancer Neg Hx   . Gallbladder disease Neg Hx     Social History   Socioeconomic History  . Marital status: Married    Spouse name: Not on file  . Number of children: 0  . Years of education: Not on file  . Highest education level: Not on file  Occupational History  . 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  Social Needs  . Financial resource strain: Not on file  . Food insecurity:    Worry: Not on file    Inability: Not on file  . Transportation needs:    Medical: Not on file    Non-medical: Not on file  Tobacco Use  . Smoking status: Former Smoker    Packs/day: 1.50    Years: 8.00    Pack years: 12.00    Types: Cigarettes    Last attempt to quit: 06/16/2012    Years since quitting: 5.9  . Smokeless tobacco: Never Used  Substance and Sexual  Activity  . Alcohol use: Yes    Alcohol/week: 1.0 standard drinks    Types: 1 Cans of beer per week    Comment: occasional  . Drug use: No  . Sexual activity: Yes  Lifestyle  . Physical activity:    Days per week: Not on file    Minutes per session: Not on file  . Stress: Not on file  Relationships  . Social connections:    Talks on phone: Not on file    Gets together: Not on file    Attends religious service: Not on file    Active member of club or organization: Not on file    Attends meetings of clubs or organizations: Not on file    Relationship status: Not on file  . Intimate partner violence:    Fear of current or ex partner: Not on file    Emotionally abused: Not on file    Physically abused: Not on file    Forced sexual activity: Not on file  Other Topics Concern  . Not on file  Social History Narrative  . Not on file     Constitutional: Denies fever,  malaise, fatigue, headache or abrupt weight changes.  HEENT: Denies eye pain, eye redness, ear pain, ringing in the ears, wax buildup, runny nose, nasal congestion, bloody nose, or sore throat. Respiratory: Denies difficulty breathing, shortness of breath, cough or sputum production.   Cardiovascular: Denies chest pain, chest tightness, palpitations or swelling in the hands or feet.  Gastrointestinal: Denies abdominal pain, bloating, constipation, diarrhea or blood in the stool.  GU: Denies urgency, frequency, pain with urination, burning sensation, blood in urine, odor or discharge. Musculoskeletal: Denies decrease in range of motion, difficulty with gait, muscle pain or joint pain and swelling.  Skin: Denies redness, rashes, lesions or ulcercations.  Neurological: Denies dizziness, difficulty with memory, difficulty with speech or problems with balance and coordination.  Psych: Denies anxiety, depression, SI/HI.  No other specific complaints in a complete review of systems (except as listed in HPI  above).  Objective:   Physical Exam        Assessment & Plan:

## 2018-06-17 ENCOUNTER — Telehealth: Payer: Self-pay | Admitting: Internal Medicine

## 2018-06-17 ENCOUNTER — Other Ambulatory Visit: Payer: Self-pay | Admitting: Internal Medicine

## 2018-06-17 NOTE — Telephone Encounter (Signed)
Pt called office requesting a refill on the Losartan. Pt uses [facility] on [address]

## 2018-06-18 MED ORDER — LOSARTAN POTASSIUM 25 MG PO TABS: 25.0000 mg | ORAL_TABLET | Freq: Every day | ORAL | 0 refills | Status: DC

## 2018-06-18 NOTE — Addendum Note (Signed)
Addended by: Roena Malady on: 06/18/2018 08:45 AM   Modules accepted: Orders

## 2018-06-18 NOTE — Telephone Encounter (Signed)
Rx sent through [facility]  

## 2018-06-22 ENCOUNTER — Ambulatory Visit: Payer: Managed Care, Other (non HMO) | Admitting: Internal Medicine

## 2018-06-28 ENCOUNTER — Ambulatory Visit: Payer: Managed Care, Other (non HMO) | Admitting: Internal Medicine

## 2018-06-28 ENCOUNTER — Encounter: Payer: Self-pay | Admitting: Internal Medicine

## 2018-06-28 VITALS — BP 136/88 | HR 88 | Temp 98.0°F | Wt 296.0 lb

## 2018-06-28 DIAGNOSIS — I10 Essential (primary) hypertension: Secondary | ICD-10-CM | POA: Diagnosis not present

## 2018-06-28 LAB — BASIC METABOLIC PANEL
BUN: 14 mg/dL (ref 6–23)
CO2: 30 meq/L (ref 19–32)
Calcium: 10 mg/dL (ref 8.4–10.5)
Chloride: 102 mEq/L (ref 96–112)
Creatinine, Ser: 0.82 mg/dL (ref 0.40–1.50)
GFR: 116.33 mL/min (ref >60.00)
GLUCOSE: 93 mg/dL (ref 70–99)
POTASSIUM: 4.7 meq/L (ref 3.5–5.1)
Sodium: 138 mEq/L (ref 135–145)

## 2018-06-28 MED ORDER — LOSARTAN POTASSIUM 50 MG PO TABS
50.0000 mg | ORAL_TABLET | Freq: Every day | ORAL | 3 refills | Status: DC
Start: 2018-06-28 — End: 2019-07-22

## 2018-06-28 NOTE — Patient Instructions (Signed)

## 2018-06-28 NOTE — Progress Notes (Signed)
Subjective:    Patient ID: Alexander Navarro, male    DOB: Aug 05, 1985, 33 y.o.   MRN: [id number]  HPI  Pt presents to the clinic today for follow up for HTN. He was started on Losartan about 7 weeks ago. He has been taking the medication as prescribed, and denies adverse side effects. His BP today is 140/82, but it hasn't even been an hour since he took his medication. ECG from 08/2014 reviewed.  Review of Systems  Past Medical History:  Diagnosis Date  . Fatty liver   . Frequent headaches   . GERD (gastroesophageal reflux disease)   . Obesity     Current Outpatient Medications  Medication Sig Dispense Refill  . losartan (COZAAR) 25 MG tablet Take 1 tablet (25 mg total) by mouth daily. 30 tablet 0   No current facility-administered medications for this visit.     Allergies  Allergen Reactions  . Shrimp [Shellfish Allergy] Nausea And Vomiting    Family History  Problem Relation Age of Onset  . Hypertension Mother   . Thyroid disease Mother   . Heart disease Father   . Stomach cancer Maternal Grandmother   . Prostate cancer Maternal Grandfather   . Throat cancer Paternal Aunt   . Colon cancer Neg Hx   . Colon polyps Neg Hx   . Kidney disease Neg Hx   . Esophageal cancer Neg Hx   . Gallbladder disease Neg Hx     Social History   Socioeconomic History  . Marital status: Married    Spouse name: Not on file  . Number of children: 0  . Years of education: Not on file  . Highest education level: Not on file  Occupational History  . 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  Social Needs  . Financial resource strain: Not on file  . Food insecurity:    Worry: Not on file    Inability: Not on file  . Transportation needs:    Medical: Not on file    Non-medical: Not on file  Tobacco Use  . Smoking status: Former Smoker    Packs/day: 1.50    Years: 8.00    Pack years: 12.00    Types: Cigarettes    Last attempt to quit: 06/16/2012    Years since quitting: 6.0  . Smokeless  tobacco: Never Used  Substance and Sexual Activity  . Alcohol use: Yes    Alcohol/week: 1.0 standard drinks    Types: 1 Cans of beer per week    Comment: occasional  . Drug use: No  . Sexual activity: Yes  Lifestyle  . Physical activity:    Days per week: Not on file    Minutes per session: Not on file  . Stress: Not on file  Relationships  . Social connections:    Talks on phone: Not on file    Gets together: Not on file    Attends religious service: Not on file    Active member of club or organization: Not on file    Attends meetings of clubs or organizations: Not on file    Relationship status: Not on file  . Intimate partner violence:    Fear of current or ex partner: Not on file    Emotionally abused: Not on file    Physically abused: Not on file    Forced sexual activity: Not on file  Other Topics Concern  . Not on file  Social History Narrative  . Not on file  Constitutional: Denies fever, malaise, fatigue, headache or abrupt weight changes.  Respiratory: Denies difficulty breathing, shortness of breath, cough or sputum production.   Cardiovascular: Denies chest pain, chest tightness, palpitations or swelling in the hands or feet.  Neurological: Denies dizziness, difficulty with memory, difficulty with speech or problems with balance and coordination.    No other specific complaints in a complete review of systems (except as listed in HPI above).     Objective:   Physical Exam   BP 140/82   Pulse 88   Temp 98 F (36.7 C) (Oral)   Wt 296 lb (134.3 kg)   SpO2 97%   BMI 39.05 kg/m  Wt Readings from Last 3 Encounters:  06/28/18 296 lb (134.3 kg)  05/11/18 297 lb (134.7 kg)  02/10/18 (!) 304 lb (137.9 kg)    General: Appears his stated age, obese in NAD. Cardiovascular: Normal rate and rhythm. S1,S2 noted.  No murmur, rubs or gallops noted. No JVD or BLE edema.  Pulmonary/Chest: Normal effort and positive vesicular breath sounds. No respiratory  distress. No wheezes, rales or ronchi noted.  Neurological: Alert and oriented.    BMET    Component Value Date/Time   NA 140 08/25/2014 1847   K 3.8 08/25/2014 1847   CL 102 08/25/2014 1847   CO2 25 10/12/2013 0114   GLUCOSE 107 (H) 08/25/2014 1847   BUN 15 08/25/2014 1847   CREATININE 0.90 08/25/2014 1847   CALCIUM 9.9 10/12/2013 0114   GFRNONAA >90 10/12/2013 0114   GFRAA >90 10/12/2013 0114    Lipid Panel     Component Value Date/Time   CHOL 195 01/27/2015   TRIG 324 (A) 01/27/2015   HDL 34 (A) 01/27/2015   CHOLHDL 5 08/29/2013 1104   VLDL 21.6 08/29/2013 1104   LDLCALC 96 01/27/2015    CBC    Component Value Date/Time   WBC 9.3 08/25/2014 1828   RBC 5.56 08/25/2014 1828   HGB 16.7 08/25/2014 1847   HCT 49.0 08/25/2014 1847   PLT 149 (L) 08/25/2014 1828   MCV 79.1 08/25/2014 1828   MCH 27.9 08/25/2014 1828   MCHC 35.2 08/25/2014 1828   RDW 12.8 08/25/2014 1828   LYMPHSABS 2.4 08/25/2014 1828   MONOABS 0.5 08/25/2014 1828   EOSABS 0.1 08/25/2014 1828   BASOSABS 0.0 08/25/2014 1828    Hgb A1C Lab Results  Component Value Date   HGBA1C 5.9 01/27/2015           Assessment & Plan:

## 2018-06-28 NOTE — Assessment & Plan Note (Signed)
Uncontrolled on Losartan 25 mg Will increase to Losartan 50 mg daily, RX sent to pharmacy Reinforced DASH diet and exercise for weight loss BMET today  RTC in 2 weeks for HTN followup

## 2018-07-14 ENCOUNTER — Ambulatory Visit: Payer: Managed Care, Other (non HMO) | Admitting: Internal Medicine

## 2018-07-14 ENCOUNTER — Encounter: Payer: Self-pay | Admitting: Internal Medicine

## 2018-07-14 DIAGNOSIS — I10 Essential (primary) hypertension: Secondary | ICD-10-CM | POA: Diagnosis not present

## 2018-07-14 NOTE — Progress Notes (Signed)
Subjective:    Patient ID: Alexander Navarro, male    DOB: 04/19/1986, 33 y.o.   MRN: [id number]  HPI  Pt presents to the clinic today for 2 week follow up of HTN. At his last visit, his Losartan was increased to 50 mg daily. He has been taking the medication as prescribed. He denies adverse effects. His BP today is 134/76. ECG from 08/2014 reviewed.  Review of Systems  Past Medical History:  Diagnosis Date  . Fatty liver   . Frequent headaches   . GERD (gastroesophageal reflux disease)   . Obesity     Current Outpatient Medications  Medication Sig Dispense Refill  . losartan (COZAAR) 50 MG tablet Take 1 tablet (50 mg total) by mouth daily. 90 tablet 3   No current facility-administered medications for this visit.     Allergies  Allergen Reactions  . Shrimp [Shellfish Allergy] Nausea And Vomiting    Family History  Problem Relation Age of Onset  . Hypertension Mother   . Thyroid disease Mother   . Heart disease Father   . Stomach cancer Maternal Grandmother   . Prostate cancer Maternal Grandfather   . Throat cancer Paternal Aunt   . Colon cancer Neg Hx   . Colon polyps Neg Hx   . Kidney disease Neg Hx   . Esophageal cancer Neg Hx   . Gallbladder disease Neg Hx     Social History   Socioeconomic History  . Marital status: Married    Spouse name: Not on file  . Number of children: 0  . Years of education: Not on file  . Highest education level: Not on file  Occupational History  . 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  Social Needs  . Financial resource strain: Not on file  . Food insecurity:    Worry: Not on file    Inability: Not on file  . Transportation needs:    Medical: Not on file    Non-medical: Not on file  Tobacco Use  . Smoking status: Former Smoker    Packs/day: 1.50    Years: 8.00    Pack years: 12.00    Types: Cigarettes    Last attempt to quit: 06/16/2012    Years since quitting: 6.0  . Smokeless tobacco: Never Used  Substance and Sexual  Activity  . Alcohol use: Yes    Alcohol/week: 1.0 standard drinks    Types: 1 Cans of beer per week    Comment: occasional  . Drug use: No  . Sexual activity: Yes  Lifestyle  . Physical activity:    Days per week: Not on file    Minutes per session: Not on file  . Stress: Not on file  Relationships  . Social connections:    Talks on phone: Not on file    Gets together: Not on file    Attends religious service: Not on file    Active member of club or organization: Not on file    Attends meetings of clubs or organizations: Not on file    Relationship status: Not on file  . Intimate partner violence:    Fear of current or ex partner: Not on file    Emotionally abused: Not on file    Physically abused: Not on file    Forced sexual activity: Not on file  Other Topics Concern  . Not on file  Social History Narrative  . Not on file     Constitutional: Denies fever, malaise, fatigue, headache  or abrupt weight changes.  Respiratory: Denies difficulty breathing, shortness of breath, cough or sputum production.   Cardiovascular: Denies chest pain, chest tightness, palpitations or swelling in the hands or feet.  GNeurological: Denies dizziness, difficulty with memory, difficulty with speech or problems with balance and coordination.    No other specific complaints in a complete review of systems (except as listed in HPI above).     Objective:   Physical Exam   BP 134/76   Pulse 82   Temp 98 F (36.7 C) (Oral)   Wt 299 lb (135.6 kg)   SpO2 98%   BMI 39.45 kg/m  Wt Readings from Last 3 Encounters:  07/14/18 299 lb (135.6 kg)  06/28/18 296 lb (134.3 kg)  05/11/18 297 lb (134.7 kg)    General: Appears his stated age, obese, in NAD. Cardiovascular: Normal rate and rhythm. S1,S2 noted.  No murmur, rubs or gallops noted. No JVD or BLE edema.  Pulmonary/Chest: Normal effort and positive vesicular breath sounds. No respiratory distress. No wheezes, rales or ronchi noted.    Neurological: Alert and oriented.    BMET    Component Value Date/Time   NA 138 06/28/2018 0904   K 4.7 06/28/2018 0904   CL 102 06/28/2018 0904   CO2 30 06/28/2018 0904   GLUCOSE 93 06/28/2018 0904   BUN 14 06/28/2018 0904   CREATININE 0.82 06/28/2018 0904   CALCIUM 10.0 06/28/2018 0904   GFRNONAA >90 10/12/2013 0114   GFRAA >90 10/12/2013 0114    Lipid Panel     Component Value Date/Time   CHOL 195 01/27/2015   TRIG 324 (A) 01/27/2015   HDL 34 (A) 01/27/2015   CHOLHDL 5 08/29/2013 1104   VLDL 21.6 08/29/2013 1104   LDLCALC 96 01/27/2015    CBC    Component Value Date/Time   WBC 9.3 08/25/2014 1828   RBC 5.56 08/25/2014 1828   HGB 16.7 08/25/2014 1847   HCT 49.0 08/25/2014 1847   PLT 149 (L) 08/25/2014 1828   MCV 79.1 08/25/2014 1828   MCH 27.9 08/25/2014 1828   MCHC 35.2 08/25/2014 1828   RDW 12.8 08/25/2014 1828   LYMPHSABS 2.4 08/25/2014 1828   MONOABS 0.5 08/25/2014 1828   EOSABS 0.1 08/25/2014 1828   BASOSABS 0.0 08/25/2014 1828    Hgb A1C Lab Results  Component Value Date   HGBA1C 5.9 01/27/2015           Assessment & Plan:

## 2018-07-14 NOTE — Assessment & Plan Note (Signed)
Reasonable control on Losartan Reinforced DASH diet and exercise for weight loss  RTC in 7 months for general exam

## 2018-07-14 NOTE — Patient Instructions (Signed)

## 2019-04-05 ENCOUNTER — Telehealth: Payer: Self-pay

## 2019-04-05 ENCOUNTER — Encounter: Payer: Self-pay | Admitting: Internal Medicine

## 2019-04-05 ENCOUNTER — Ambulatory Visit (INDEPENDENT_AMBULATORY_CARE_PROVIDER_SITE_OTHER): Payer: Managed Care, Other (non HMO) | Admitting: Internal Medicine

## 2019-04-05 ENCOUNTER — Other Ambulatory Visit: Payer: Self-pay

## 2019-04-05 VITALS — BP 138/84 | HR 104 | Temp 98.6°F | Wt 322.0 lb

## 2019-04-05 DIAGNOSIS — S29012A Strain of muscle and tendon of back wall of thorax, initial encounter: Secondary | ICD-10-CM

## 2019-04-05 MED ORDER — METHYLPREDNISOLONE ACETATE 80 MG/ML IJ SUSP
80.0000 mg | Freq: Once | INTRAMUSCULAR | Status: AC
  Administered 2019-04-05: 80 mg via INTRAMUSCULAR

## 2019-04-05 MED ORDER — TRAMADOL HCL 50 MG PO TABS: 50.0000 mg | ORAL_TABLET | Freq: Two times a day (BID) | ORAL | 0 refills | Status: AC | PRN

## 2019-04-05 MED ORDER — CYCLOBENZAPRINE HCL 10 MG PO TABS: 10.0000 mg | ORAL_TABLET | Freq: Three times a day (TID) | ORAL | 0 refills | Status: DC | PRN

## 2019-04-05 NOTE — Telephone Encounter (Signed)
Pt having pain between lt shoulder blade and spine that started on and off for few months. Now is constant achy throbbing pain for 3-4 days. If back pain is hurting worse back pain radiates to lt upper chest also pain radiates into lt side of neck which also causes h/a.last night had the neck pain also. .pain level now is 5. No N&V,dizziness and no weakness. Pt has been taking BP med but does not have a way to ck BP. Pt does not have SOB but does not want to take deep breath because it makes more pain in chest and back. Pt does do a lot of lifting at work and sometimes feels like has pulled something. Pt has no covid symptoms, no travel and no known exposure to + covid. ED precautions given and pt voiced understanding. Pt scheduled in office appt today at 4 PM.(pt needed late appt).

## 2019-04-05 NOTE — Telephone Encounter (Signed)
See OV note.  

## 2019-04-05 NOTE — Progress Notes (Signed)
Subjective:    Patient ID: Alexander Navarro, male    DOB: 02/19/1986, 33 y.o.   MRN: [id number]  HPI  Pt presents to the clinic today with back pain. This started 3-4 months ago. The pain is located in between his shoulders. Over the last week, the pain has started to radiate through his body to his anterior chest and up to his neck. He reports the pain used to be achy and intermittent but is now more sharp and stabbing. He denies dizziness, visual changes, chest pain, chest tightness, palpitation or shortness of breath. He denies any injury that he is aware of but does a lot of heavy lifting for his job. He is also constantly in and out of the cold. He has tried Ibuprofen, Aleve and Tylenol with minimal relief.   Review of Systems      Past Medical History:  Diagnosis Date  . Fatty liver   . Frequent headaches   . GERD (gastroesophageal reflux disease)   . Obesity     Current Outpatient Medications  Medication Sig Dispense Refill  . losartan (COZAAR) 50 MG tablet Take 1 tablet (50 mg total) by mouth daily. 90 tablet 3   No current facility-administered medications for this visit.     Allergies  Allergen Reactions  . Shrimp [Shellfish Allergy] Nausea And Vomiting    Family History  Problem Relation Age of Onset  . Hypertension Mother   . Thyroid disease Mother   . Heart disease Father   . Stomach cancer Maternal Grandmother   . Prostate cancer Maternal Grandfather   . Throat cancer Paternal Aunt   . Colon cancer Neg Hx   . Colon polyps Neg Hx   . Kidney disease Neg Hx   . Esophageal cancer Neg Hx   . Gallbladder disease Neg Hx     Social History   Socioeconomic History  . Marital status: Married    Spouse name: Not on file  . Number of children: 0  . Years of education: Not on file  . Highest education level: Not on file  Occupational History  . 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  Social Needs  . Financial resource strain: Not on file  . Food insecurity   Worry: Not on file    Inability: Not on file  . Transportation needs    Medical: Not on file    Non-medical: Not on file  Tobacco Use  . Smoking status: Former Smoker    Packs/day: 1.50    Years: 8.00    Pack years: 12.00    Types: Cigarettes    Quit date: 06/16/2012    Years since quitting: 6.8  . Smokeless tobacco: Never Used  Substance and Sexual Activity  . Alcohol use: Yes    Alcohol/week: 1.0 standard drinks    Types: 1 Cans of beer per week    Comment: occasional  . Drug use: No  . Sexual activity: Yes  Lifestyle  . Physical activity    Days per week: Not on file    Minutes per session: Not on file  . Stress: Not on file  Relationships  . Social Herbalist on phone: Not on file    Gets together: Not on file    Attends religious service: Not on file    Active member of club or organization: Not on file    Attends meetings of clubs or organizations: Not on file    Relationship status: Not on file  .  Intimate partner violence    Fear of current or ex partner: Not on file    Emotionally abused: Not on file    Physically abused: Not on file    Forced sexual activity: Not on file  Other Topics Concern  . Not on file  Social History Narrative  . Not on file     Constitutional: Pt reports weight gain. Denies fever, malaise, fatigue, headache.  HEENT: Denies eye pain, eye redness, ear pain, ringing in the ears, wax buildup, runny nose, nasal congestion, bloody nose, or sore throat. Respiratory: Denies difficulty breathing, shortness of breath, cough or sputum production.   Cardiovascular: Denies chest pain, chest tightness, palpitations or swelling in the hands or feet.  Gastrointestinal: Denies abdominal pain, bloating, constipation, diarrhea or blood in the stool.  GU: Denies urgency, frequency, pain with urination, burning sensation, blood in urine, odor or discharge. Musculoskeletal: Pt reports back pain, left chest wall pain, neck pain. Denies decrease  in range of motion, difficulty with gait, or joint  swelling.  Skin: Denies redness, rashes, lesions or ulcercations.  Neurological: Denies dizziness, difficulty with memory, difficulty with speech or problems with balance and coordination.   No other specific complaints in a complete review of systems (except as listed in HPI above).  Objective:   Physical Exam   BP 138/84   Pulse (!) 104   Temp 98.6 F (37 C) (Temporal)   Wt (!) 322 lb (146.1 kg)   SpO2 98%   BMI 42.48 kg/m  Wt Readings from Last 3 Encounters:  04/05/19 (!) 322 lb (146.1 kg)  07/14/18 299 lb (135.6 kg)  06/28/18 296 lb (134.3 kg)    General: Appears his stated age, obese, in NAD. Skin: Warm, dry and intact. No rashesnoted. Cardiovascular: Tachycardic with normal rhythm. S1,S2 noted.  No murmur, rubs or gallops noted. No JVD or BLE edema.  Pulmonary/Chest: Normal effort and positive vesicular breath sounds. No respiratory distress. No wheezes, rales or ronchi noted.  Musculoskeletal: Normal flexion, extension and rotation of the cervical spine. No bony tenderness noted over the spine. Normal abduction, adduction, internal and external rotation of the left shoulder. No pain with palpation of the shoulder. Left upper chest wall tender to palpation. Strength 5/5 BUE/BLE. No difficulty with gait.  Neurological: Alert and oriented.    BMET    Component Value Date/Time   NA 138 06/28/2018 0904   K 4.7 06/28/2018 0904   CL 102 06/28/2018 0904   CO2 30 06/28/2018 0904   GLUCOSE 93 06/28/2018 0904   BUN 14 06/28/2018 0904   CREATININE 0.82 06/28/2018 0904   CALCIUM 10.0 06/28/2018 0904   GFRNONAA >90 10/12/2013 0114   GFRAA >90 10/12/2013 0114    Lipid Panel     Component Value Date/Time   CHOL 195 01/27/2015   TRIG 324 (A) 01/27/2015   HDL 34 (A) 01/27/2015   CHOLHDL 5 08/29/2013 1104   VLDL 21.6 08/29/2013 1104   LDLCALC 96 01/27/2015    CBC    Component Value Date/Time   WBC 9.3 08/25/2014 1828    RBC 5.56 08/25/2014 1828   HGB 16.7 08/25/2014 1847   HCT 49.0 08/25/2014 1847   PLT 149 (L) 08/25/2014 1828   MCV 79.1 08/25/2014 1828   MCH 27.9 08/25/2014 1828   MCHC 35.2 08/25/2014 1828   RDW 12.8 08/25/2014 1828   LYMPHSABS 2.4 08/25/2014 1828   MONOABS 0.5 08/25/2014 1828   EOSABS 0.1 08/25/2014 1828   BASOSABS 0.0 08/25/2014  1828    Hgb A1C Lab Results  Component Value Date   HGBA1C 5.9 01/27/2015           Assessment & Plan:   Muscle Strain of Back:  Does not appear cardiovascular or GI related Encouraged heat and massage Avoid heavy lifting x 1 week Depo Medrol 80 mg IM today RX for Flexeril 10 mg QHS prn- sedation caution given RX for Tramadol 50 mg TID prn for severe pain only Encouraged stretching, back exercises given  Return precautions discussed Nicki Reaper, NP

## 2019-04-05 NOTE — Patient Instructions (Signed)
Muscle Strain A muscle strain is an injury that happens when a muscle is stretched longer than normal. This can happen during a fall, sports, or lifting. This can tear some muscle fibers. Usually, recovery from muscle strain takes 1-2 weeks. Complete healing normally takes 5-6 weeks. This condition is first treated with PRICE therapy. This involves:  Protecting your muscle from being injured again.  Resting your injured muscle.  Icing your injured muscle.  Applying pressure (compression) to your injured muscle. This may be done with a splint or elastic bandage.  Raising (elevating) your injured muscle. Your doctor may also recommend medicine for pain. Follow these instructions at home: If you have a splint:  Wear the splint as told by your doctor. Take it off only as told by your doctor.  Loosen the splint if your fingers or toes tingle, get numb, or turn cold and blue.  Keep the splint clean.  If the splint is not waterproof: ? Do not let it get wet. ? Cover it with a watertight covering when you take a bath or a shower. Managing pain, stiffness, and swelling   If directed, put ice on your injured area. ? If you have a removable splint, take it off as told by your doctor. ? Put ice in a plastic bag. ? Place a towel between your skin and the bag. ? Leave the ice on for 20 minutes, 2-3 times a day.  Move your fingers or toes often. This helps to avoid stiffness and lessen swelling.  Raise your injured area above the level of your heart while you are sitting or lying down.  Wear an elastic bandage as told by your doctor. Make sure it is not too tight. General instructions  Take over-the-counter and prescription medicines only as told by your doctor.  Limit your activity. Rest your injured muscle as told by your doctor. Your doctor may say that gentle movements are okay.  If physical therapy was prescribed, do exercises as told by your doctor.  Do not put pressure on any  part of the splint until it is fully hardened. This may take many hours.  Do not use any products that contain nicotine or tobacco, such as cigarettes and e-cigarettes. These can delay bone healing. If you need help quitting, ask your doctor.  Warm up before you exercise. This helps to prevent more muscle strains.  Ask your doctor when it is safe to drive if you have a splint.  Keep all follow-up visits as told by your doctor. This is important. Contact a doctor if:  You have more pain or swelling in your injured area. Get help right away if:  You have any of these problems in your injured area: ? You have numbness. ? You have tingling. ? You lose a lot of strength. Summary  A muscle strain is an injury that happens when a muscle is stretched longer than normal.  This condition is first treated with PRICE therapy. This includes protecting, resting, icing, adding pressure, and raising your injury.  Limit your activity. Rest your injured muscle as told by your doctor. Your doctor may say that gentle movements are okay.  Warm up before you exercise. This helps to prevent more muscle strains. This information is not intended to replace advice given to you by your health care provider. Make sure you discuss any questions you have with your health care provider. Document Released: 03/11/2008 Document Revised: 07/29/2018 Document Reviewed: 07/09/2016 Elsevier Patient Education  2020 [employer]  

## 2019-04-13 ENCOUNTER — Encounter: Payer: Self-pay | Admitting: Internal Medicine

## 2019-05-25 ENCOUNTER — Other Ambulatory Visit: Payer: Self-pay

## 2019-05-25 ENCOUNTER — Ambulatory Visit (INDEPENDENT_AMBULATORY_CARE_PROVIDER_SITE_OTHER): Payer: Managed Care, Other (non HMO) | Admitting: Internal Medicine

## 2019-05-25 ENCOUNTER — Encounter: Payer: Self-pay | Admitting: Internal Medicine

## 2019-05-25 ENCOUNTER — Other Ambulatory Visit: Payer: Self-pay | Admitting: Internal Medicine

## 2019-05-25 ENCOUNTER — Telehealth: Payer: Self-pay | Admitting: Internal Medicine

## 2019-05-25 VITALS — BP 142/92 | HR 106 | Temp 98.7°F | Ht 73.0 in | Wt 322.0 lb

## 2019-05-25 DIAGNOSIS — G4700 Insomnia, unspecified: Secondary | ICD-10-CM | POA: Insufficient documentation

## 2019-05-25 DIAGNOSIS — K429 Umbilical hernia without obstruction or gangrene: Secondary | ICD-10-CM | POA: Diagnosis not present

## 2019-05-25 DIAGNOSIS — K219 Gastro-esophageal reflux disease without esophagitis: Secondary | ICD-10-CM

## 2019-05-25 DIAGNOSIS — Z0000 Encounter for general adult medical examination without abnormal findings: Secondary | ICD-10-CM | POA: Diagnosis not present

## 2019-05-25 DIAGNOSIS — I10 Essential (primary) hypertension: Secondary | ICD-10-CM

## 2019-05-25 DIAGNOSIS — F5101 Primary insomnia: Secondary | ICD-10-CM

## 2019-05-25 DIAGNOSIS — L301 Dyshidrosis [pompholyx]: Secondary | ICD-10-CM

## 2019-05-25 MED ORDER — AMLODIPINE BESYLATE 10 MG PO TABS: 10.0000 mg | ORAL_TABLET | Freq: Every day | ORAL | 0 refills | Status: DC

## 2019-05-25 MED ORDER — MOMETASONE FUROATE 0.1 % EX OINT
TOPICAL_OINTMENT | Freq: Every day | CUTANEOUS | 0 refills | Status: DC
Start: 2019-05-25 — End: 2021-11-29

## 2019-05-25 NOTE — Progress Notes (Signed)
Subjective:    Patient ID: Alexander Navarro, male    DOB: 11/09/1985, 33 y.o.   MRN: [id number]  HPI  Pt presents to the clinic today for his annual exam. He is also due to follow up chronic conditions.   HTN: His BP today is 142/92. He is taking Losartan as prescribed. He does not monitor his blood sugar. He has gained 25 lbs in the last year. ECG from 08/2014 reviewed.  GERD: Triggered by spicy and tomato based foods. He takes Tums as needed with good relief of symptoms. He has taken Esomeprazole in the past but feels like he does not currently need this. Upper GI from 06/2014.  Flu: never Tetanus: 10/2014 Dentist: as needed  Diet: He does eat meat. He consumes some veggies, more fruits. He does eat fried foods. He drinks mostly Diet Mt. Dew Exercise: None  Review of Systems      Past Medical History:  Diagnosis Date  . Fatty liver   . Frequent headaches   . GERD (gastroesophageal reflux disease)   . Obesity     Current Outpatient Medications  Medication Sig Dispense Refill  . cyclobenzaprine (FLEXERIL) 10 MG tablet Take 1 tablet (10 mg total) by mouth 3 (three) times daily as needed for muscle spasms. 20 tablet 0  . losartan (COZAAR) 50 MG tablet Take 1 tablet (50 mg total) by mouth daily. 90 tablet 3   No current facility-administered medications for this visit.     Allergies  Allergen Reactions  . Shrimp [Shellfish Allergy] Nausea And Vomiting    Family History  Problem Relation Age of Onset  . Hypertension Mother   . Thyroid disease Mother   . Heart disease Father   . Stomach cancer Maternal Grandmother   . Prostate cancer Maternal Grandfather   . Throat cancer Paternal Aunt   . Colon cancer Neg Hx   . Colon polyps Neg Hx   . Kidney disease Neg Hx   . Esophageal cancer Neg Hx   . Gallbladder disease Neg Hx     Social History   Socioeconomic History  . Marital status: Married    Spouse name: Not on file  . Number of children: 0  . Years of education:  Not on file  . Highest education level: Not on file  Occupational History  . 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  Social Needs  . Financial resource strain: Not on file  . Food insecurity    Worry: Not on file    Inability: Not on file  . Transportation needs    Medical: Not on file    Non-medical: Not on file  Tobacco Use  . Smoking status: Former Smoker    Packs/day: 1.50    Years: 8.00    Pack years: 12.00    Types: Cigarettes    Quit date: 06/16/2012    Years since quitting: 6.9  . Smokeless tobacco: Never Used  Substance and Sexual Activity  . Alcohol use: Yes    Alcohol/week: 1.0 standard drinks    Types: 1 Cans of beer per week    Comment: occasional  . Drug use: No  . Sexual activity: Yes  Lifestyle  . Physical activity    Days per week: Not on file    Minutes per session: Not on file  . Stress: Not on file  Relationships  . Social Musicianconnections    Talks on phone: Not on file    Gets together: Not on file  Attends religious service: Not on file    Active member of club or organization: Not on file    Attends meetings of clubs or organizations: Not on file    Relationship status: Not on file  . Intimate partner violence    Fear of current or ex partner: Not on file    Emotionally abused: Not on file    Physically abused: Not on file    Forced sexual activity: Not on file  Other Topics Concern  . Not on file  Social History Narrative  . Not on file     Constitutional: Pt reports weight gain. Denies fever, malaise, fatigue, headache.  HEENT: Denies eye pain, eye redness, ear pain, ringing in the ears, wax buildup, runny nose, nasal congestion, bloody nose, or sore throat. Respiratory: Denies difficulty breathing, shortness of breath, cough or sputum production.   Cardiovascular: Denies chest pain, chest tightness, palpitations or swelling in the hands or feet.  Gastrointestinal: Denies abdominal pain, bloating, constipation, diarrhea or blood in the stool.   GU: Denies urgency, frequency, pain with urination, burning sensation, blood in urine, odor or discharge. Musculoskeletal: Denies decrease in range of motion, difficulty with gait, muscle pain or joint pain and swelling.  Skin: Pt reports dry, cracked skin on hands. Denies redness, rashes, lesions or ulcercations.  Neurological: Pt reports insomnia. Denies dizziness, difficulty with memory, difficulty with speech or problems with balance and coordination.  Psych: Denies anxiety, depression, SI/HI.  No other specific complaints in a complete review of systems (except as listed in HPI above).  Objective:   Physical Exam BP (!) 142/92   Pulse (!) 106   Temp 98.7 F (37.1 C) (Temporal)   Ht 6\' 1"  (1.854 m)   Wt (!) 322 lb (146.1 kg)   SpO2 98%   BMI 42.48 kg/m   Wt Readings from Last 3 Encounters:  04/05/19 (!) 322 lb (146.1 kg)  07/14/18 299 lb (135.6 kg)  06/28/18 296 lb (134.3 kg)    General: Appears his stated age, obese, in NAD. Skin: Warm, dry and intact. Dyshidrotic eczema noted of bilateral knuckles, tips of fingers. HEENT: Head: normal shape and size; Eyes: sclera white, no icterus, conjunctiva pink, PERRLA and EOMs intact;  Neck:  Neck supple, trachea midline. No masses, lumps or thyromegaly present.  Cardiovascular: Normal rate and rhythm. S1,S2 noted.  No murmur, rubs or gallops noted. No JVD or BLE edema.  Pulmonary/Chest: Normal effort and positive vesicular breath sounds. No respiratory distress. No wheezes, rales or ronchi noted.  Abdomen: Soft and nontender. Normal bowel sounds. Umbilical hernia noted. Liver, spleen and kidneys non palpable. Musculoskeletal: Strength 5/5 BUE/BLE. No difficulty with gait.  Neurological: Alert and oriented. Cranial nerves II-XII grossly intact. Coordination normal.  Psychiatric: Mood and affect normal. Behavior is normal. Judgment and thought content normal.     BMET    Component Value Date/Time   NA 138 06/28/2018 0904   K 4.7  06/28/2018 0904   CL 102 06/28/2018 0904   CO2 30 06/28/2018 0904   GLUCOSE 93 06/28/2018 0904   BUN 14 06/28/2018 0904   CREATININE 0.82 06/28/2018 0904   CALCIUM 10.0 06/28/2018 0904   GFRNONAA >90 10/12/2013 0114   GFRAA >90 10/12/2013 0114    Lipid Panel     Component Value Date/Time   CHOL 195 01/27/2015   TRIG 324 (A) 01/27/2015   HDL 34 (A) 01/27/2015   CHOLHDL 5 08/29/2013 1104   VLDL 21.6 08/29/2013 1104   LDLCALC  96 01/27/2015    CBC    Component Value Date/Time   WBC 9.3 08/25/2014 1828   RBC 5.56 08/25/2014 1828   HGB 16.7 08/25/2014 1847   HCT 49.0 08/25/2014 1847   PLT 149 (L) 08/25/2014 1828   MCV 79.1 08/25/2014 1828   MCH 27.9 08/25/2014 1828   MCHC 35.2 08/25/2014 1828   RDW 12.8 08/25/2014 1828   LYMPHSABS 2.4 08/25/2014 1828   MONOABS 0.5 08/25/2014 1828   EOSABS 0.1 08/25/2014 1828   BASOSABS 0.0 08/25/2014 1828    Hgb A1C Lab Results  Component Value Date   HGBA1C 5.9 01/27/2015           Assessment & Plan:   Preventative Health Maintenance:  He declines flu shot today Tetanus UTD Encouraged him to consume a balanced diet and exercise regimen Advised him to see a dentist annually Will check CBC, CMET, Lipid and A1C today He declines STD screening today  Umbilical Hernia:  U/s abdomen ordered today Consider referral to general surgery pending results  RTC in 1 year, sooner if needed Webb Silversmith, NP This visit occurred during the SARS-CoV-2 public health emergency.  Safety protocols were in place, including screening questions prior to the visit, additional usage of staff PPE, and extensive cleaning of exam room while observing appropriate contact time as indicated for disinfecting solutions.

## 2019-05-25 NOTE — Assessment & Plan Note (Signed)
Encouraged him to use luke warm water instead of hot water to wash his hands Apply lotion for eczema BID RX for Mometasone cream daily

## 2019-05-25 NOTE — Telephone Encounter (Signed)
Alexander Navarro,[facility], called. An order for abdominal ultrasound was put in today.  The order doesn't state the location of the hernia. Please add to order.They're waiting to schedule appointment until order is done.

## 2019-05-25 NOTE — Patient Instructions (Signed)

## 2019-05-25 NOTE — Assessment & Plan Note (Signed)
Uncontrolled with recent 25 weight gain Reinforced DASH diet and exercise for weight loss- he feels like he can not do that at this time Continue Losartan Add Amlodipine 10 mg daily Given his issues with insomnia (and snoring) offered referral to pulm for a sleep study but he declines at this time stating "I will never wear one of those face masks". CMET today  RTC in 2 weeks for BP check

## 2019-05-25 NOTE — Assessment & Plan Note (Signed)
Given his issues with insomnia (and snoring) offered referral to pulm for a sleep study but he declines at this time stating "I will never wear one of those face masks". Discussed how weight loss could improve his snoring and overall sleep

## 2019-05-25 NOTE — Assessment & Plan Note (Signed)
Currently not an issues Discussed avoiding foods that trigger your reflux and how weight loss could help improve symptoms Continue TUMS prn Will monitor

## 2019-05-26 LAB — CBC
HCT: 49.1 % (ref 39.0–52.0)
Hemoglobin: 16.5 g/dL (ref 13.0–17.0)
MCHC: 33.5 g/dL (ref 30.0–36.0)
MCV: 81.9 fl (ref 78.0–100.0)
Platelets: 161 10*3/uL (ref 150.0–400.0)
RBC: 6 Mil/uL — ABNORMAL HIGH (ref 4.22–5.81)
RDW: 13.6 % (ref 11.5–15.5)
WBC: 9.7 10*3/uL (ref 4.0–10.5)

## 2019-05-26 LAB — COMPREHENSIVE METABOLIC PANEL
ALT: 55 U/L — ABNORMAL HIGH (ref 0–53)
AST: 35 U/L (ref 0–37)
Albumin: 4.8 g/dL (ref 3.5–5.2)
Alkaline Phosphatase: 45 U/L (ref 39–117)
BUN: 16 mg/dL (ref 6–23)
CO2: 28 mEq/L (ref 19–32)
Calcium: 9.8 mg/dL (ref 8.4–10.5)
Chloride: 100 mEq/L (ref 96–112)
Creatinine, Ser: 0.82 mg/dL (ref 0.40–1.50)
GFR: 108.82 mL/min (ref >60.00)
Glucose, Bld: 72 mg/dL (ref 70–99)
Potassium: 4.4 mEq/L (ref 3.5–5.1)
Sodium: 136 mEq/L (ref 135–145)
Total Bilirubin: 0.4 mg/dL (ref 0.2–1.2)
Total Protein: 7.5 g/dL (ref 6.0–8.3)

## 2019-05-26 LAB — HEMOGLOBIN A1C: Hgb A1c MFr Bld: 5.6 % (ref 4.6–6.5)

## 2019-05-26 LAB — LIPID PANEL
Cholesterol: 203 mg/dL — ABNORMAL HIGH (ref 0–200)
HDL: 33.3 mg/dL — ABNORMAL LOW (ref >39.00)
Total CHOL/HDL Ratio: 6
Triglycerides: 462 mg/dL — ABNORMAL HIGH (ref 0.0–149.0)

## 2019-05-26 LAB — LDL CHOLESTEROL, DIRECT: Direct LDL: 116 mg/dL

## 2019-05-26 NOTE — Telephone Encounter (Signed)
Left message on voicemail.

## 2019-05-26 NOTE — Telephone Encounter (Signed)
Erica,[facility], returned your call.  Danae Chen said the order is okay and his appointment is 06/15/19 at 8:00.

## 2019-05-26 NOTE — Telephone Encounter (Signed)
The order states umbilical hernia. I can not change the order. Do they want me to cancel and reorder?

## 2019-05-30 ENCOUNTER — Telehealth: Payer: Self-pay | Admitting: *Deleted

## 2019-05-30 NOTE — Telephone Encounter (Signed)
Patient left a voicemail stating that he was in last week and was given a new blood pressure medication. Patient stated that he is a little confused and wants to know if he is to continue taking the medication that he was on along with the new one?

## 2019-05-31 NOTE — Telephone Encounter (Signed)
Yes, he needs to take the 2 togetether.

## 2019-06-01 NOTE — Telephone Encounter (Signed)
Pt is aware.  

## 2019-06-08 ENCOUNTER — Ambulatory Visit: Payer: Managed Care, Other (non HMO) | Admitting: Internal Medicine

## 2019-06-14 ENCOUNTER — Ambulatory Visit
Admission: EM | Admit: 2019-06-14 | Discharge: 2019-06-14 | Disposition: A | Discharge status: Home / Self Care | Payer: Managed Care, Other (non HMO) | Attending: Emergency Medicine | Admitting: Emergency Medicine

## 2019-06-14 ENCOUNTER — Ambulatory Visit: Payer: Managed Care, Other (non HMO) | Admitting: Internal Medicine

## 2019-06-14 DIAGNOSIS — R05 Cough: Secondary | ICD-10-CM | POA: Diagnosis not present

## 2019-06-14 DIAGNOSIS — R059 Cough, unspecified: Secondary | ICD-10-CM

## 2019-06-14 DIAGNOSIS — R0981 Nasal congestion: Secondary | ICD-10-CM

## 2019-06-14 DIAGNOSIS — Z20828 Contact with and (suspected) exposure to other viral communicable diseases: Secondary | ICD-10-CM

## 2019-06-14 DIAGNOSIS — Z87891 Personal history of nicotine dependence: Secondary | ICD-10-CM | POA: Diagnosis not present

## 2019-06-14 DIAGNOSIS — J029 Acute pharyngitis, unspecified: Secondary | ICD-10-CM

## 2019-06-14 DIAGNOSIS — Z20822 Contact with and (suspected) exposure to covid-19: Secondary | ICD-10-CM

## 2019-06-14 NOTE — ED Triage Notes (Signed)
Pt presents with complaints of cough, congestion and sore throat  that started over the weekend. The patient denies any fever at home.Father tested positive and he was around him on Friday.

## 2019-06-14 NOTE — Discharge Instructions (Addendum)
Your rapid COVID test is negative; the send-out test is pending.  You should self quarantine until your test result is back and is negative.    Go to the emergency department if you develop high fever, shortness of breath, severe diarrhea, or other concerning symptoms.    

## 2019-06-14 NOTE — Progress Notes (Deleted)
Subjective:    Patient ID: Alexander Navarro, male    DOB: 01/25/1986, 33 y.o.   MRN: [id number]  HPI  Pt presents to the clinic today for follow up HTN. At his last visit, Amlodipine was added to his Losartan. He has been taking the medication as prescribed. He denies adverse side effects. His BP today is. ECG from 08/2014 reviewed.   Review of Systems  Past Medical History:  Diagnosis Date  . Fatty liver   . Frequent headaches   . GERD (gastroesophageal reflux disease)   . Obesity     Current Outpatient Medications  Medication Sig Dispense Refill  . amLODipine (NORVASC) 10 MG tablet Take 1 tablet (10 mg total) by mouth daily. 30 tablet 0  . losartan (COZAAR) 50 MG tablet Take 1 tablet (50 mg total) by mouth daily. 90 tablet 3  . mometasone (ELOCON) 0.1 % ointment Apply topically daily. 45 g 0   No current facility-administered medications for this visit.    Allergies  Allergen Reactions  . Shrimp [Shellfish Allergy] Nausea And Vomiting    Family History  Problem Relation Age of Onset  . Hypertension Mother   . Thyroid disease Mother   . Heart disease Father   . Stomach cancer Maternal Grandmother   . Prostate cancer Maternal Grandfather   . Throat cancer Paternal Aunt   . Colon cancer Neg Hx   . Colon polyps Neg Hx   . Kidney disease Neg Hx   . Esophageal cancer Neg Hx   . Gallbladder disease Neg Hx     Social History   Socioeconomic History  . Marital status: Married    Spouse name: Not on file  . Number of children: 0  . Years of education: Not on file  . Highest education level: Not on file  Occupational History  . 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 Smoking status: Former Smoker    Packs/day: 1.50    Years: 8.00    Pack years: 12.00    Types: Cigarettes    Quit date: 06/16/2012    Years since quitting: 6.9  . Smokeless tobacco: Never Used  Substance and Sexual Activity  . Alcohol use: Yes    Alcohol/week: 1.0 standard drinks   Types: 1 Cans of beer per week    Comment: occasional  . Drug use: No  . Sexual activity: Yes  Other Topics Concern  . Not on file  Social History Narrative  . Not on file   Social Determinants of Health   Financial Resource Strain:   . Difficulty of Paying Living Expenses: Not on file  Food Insecurity:   . Worried About Programme researcher, broadcasting/film/videounning Out of Food in the Last Year: Not on file  . Ran Out of Food in the Last Year: Not on file  Transportation Needs:   . Lack of Transportation (Medical): Not on file  . Lack of Transportation (Non-Medical): Not on file  Physical Activity:   . Days of Exercise per Week: Not on file  . Minutes of Exercise per Session: Not on file  Stress:   . Feeling of Stress : Not on file  Social Connections:   . Frequency of Communication with Friends and Family: Not on file  . Frequency of Social Gatherings with Friends and Family: Not on file  . Attends Religious Services: Not on file  . Active Member of Clubs or Organizations: Not on file  . Attends BankerClub or Organization Meetings: Not on file  .  Marital Status: Not on file  Intimate Partner Violence:   . Fear of Current or Ex-Partner: Not on file  . Emotionally Abused: Not on file  . Physically Abused: Not on file  . Sexually Abused: Not on file     Constitutional: Denies fever, malaise, fatigue, headache or abrupt weight changes.  HEENT: Denies eye pain, eye redness, ear pain, ringing in the ears, wax buildup, runny nose, nasal congestion, bloody nose, or sore throat. Respiratory: Denies difficulty breathing, shortness of breath, cough or sputum production.   Cardiovascular: Denies chest pain, chest tightness, palpitations or swelling in the hands or feet.  Gastrointestinal: Denies abdominal pain, bloating, constipation, diarrhea or blood in the stool.  GU: Denies urgency, frequency, pain with urination, burning sensation, blood in urine, odor or discharge. Musculoskeletal: Denies decrease in range of motion,  difficulty with gait, muscle pain or joint pain and swelling.  Skin: Denies redness, rashes, lesions or ulcercations.  Neurological: Denies dizziness, difficulty with memory, difficulty with speech or problems with balance and coordination.  Psych: Denies anxiety, depression, SI/HI.  No other specific complaints in a complete review of systems (except as listed in HPI above).     Objective:   Physical Exam  There were no vitals taken for this visit. Wt Readings from Last 3 Encounters:  05/25/19 (!) 322 lb (146.1 kg)  04/05/19 (!) 322 lb (146.1 kg)  07/14/18 299 lb (135.6 kg)    General: Appears their stated age, well developed, well nourished in NAD. Skin: Warm, dry and intact. No rashes, lesions or ulcerations noted. HEENT: Head: normal shape and size; Eyes: sclera white, no icterus, conjunctiva pink, PERRLA and EOMs intact; Ears: Tm's gray and intact, normal light reflex; Nose: mucosa pink and moist, septum midline; Throat/Mouth: Teeth present, mucosa pink and moist, no exudate, lesions or ulcerations noted.  Neck:  Neck supple, trachea midline. No masses, lumps or thyromegaly present.  Cardiovascular: Normal rate and rhythm. S1,S2 noted.  No murmur, rubs or gallops noted. No JVD or BLE edema. No carotid bruits noted. Pulmonary/Chest: Normal effort and positive vesicular breath sounds. No respiratory distress. No wheezes, rales or ronchi noted.  Abdomen: Soft and nontender. Normal bowel sounds. No distention or masses noted. Liver, spleen and kidneys non palpable. Musculoskeletal: Normal range of motion. No signs of joint swelling. No difficulty with gait.  Neurological: Alert and oriented. Cranial nerves II-XII grossly intact. Coordination normal.  Psychiatric: Mood and affect normal. Behavior is normal. Judgment and thought content normal.   EKG:  BMET    Component Value Date/Time   NA 136 05/25/2019 1505   K 4.4 05/25/2019 1505   CL 100 05/25/2019 1505   CO2 28 05/25/2019  1505   GLUCOSE 72 05/25/2019 1505   BUN 16 05/25/2019 1505   CREATININE 0.82 05/25/2019 1505   CALCIUM 9.8 05/25/2019 1505   GFRNONAA >90 10/12/2013 0114   GFRAA >90 10/12/2013 0114    Lipid Panel     Component Value Date/Time   CHOL 203 (H) 05/25/2019 1505   TRIG (H) 05/25/2019 1505    462.0 Triglyceride is over 400; calculations on Lipids are invalid.   HDL 33.30 (L) 05/25/2019 1505   CHOLHDL 6 05/25/2019 1505   VLDL 21.6 08/29/2013 1104   LDLCALC 96 01/27/2015 0000    CBC    Component Value Date/Time   WBC 9.7 05/25/2019 1505   RBC 6.00 (H) 05/25/2019 1505   HGB 16.5 05/25/2019 1505   HCT 49.1 05/25/2019 1505   PLT  161.0 05/25/2019 1505   MCV 81.9 05/25/2019 1505   MCH 27.9 08/25/2014 1828   MCHC 33.5 05/25/2019 1505   RDW 13.6 05/25/2019 1505   LYMPHSABS 2.4 08/25/2014 1828   MONOABS 0.5 08/25/2014 1828   EOSABS 0.1 08/25/2014 1828   BASOSABS 0.0 08/25/2014 1828    Hgb A1C Lab Results  Component Value Date   HGBA1C 5.6 05/25/2019            Assessment & Plan:    Webb Silversmith, NP This visit occurred during the SARS-CoV-2 public health emergency.  Safety protocols were in place, including screening questions prior to the visit, additional usage of staff PPE, and extensive cleaning of exam room while observing appropriate contact time as indicated for disinfecting solutions.

## 2019-06-14 NOTE — ED Provider Notes (Signed)
Alexander Navarro    CSN: [id number] Arrival date & time: 06/14/19  1630      History   Chief Complaint Chief Complaint  Patient presents with  . covid test    HPI Alexander Navarro is a 33 y.o. male.   Patient presents with nonproductive cough, nasal congestion, sore throat x2 days.  He was with his family over the holiday and a family member tested positive for Covid today.  Patient denies fever, chills, rash, shortness of breath, vomiting, diarrhea, or other symptoms.  No treatments attempted at home.    The history is provided by the patient.    Past Medical History:  Diagnosis Date  . Fatty liver   . Frequent headaches   . GERD (gastroesophageal reflux disease)   . Obesity     Patient Active Problem List   Diagnosis Date Noted  . Dyshidrotic eczema 05/25/2019  . Insomnia 05/25/2019  . Essential hypertension 02/10/2018  . GERD (gastroesophageal reflux disease) 03/21/2014    Past Surgical History:  Procedure Laterality Date  . WISDOM TOOTH EXTRACTION         Home Medications    Prior to Admission medications   Medication Sig Start Date End Date Taking? Authorizing Provider  amLODipine (NORVASC) 10 MG tablet Take 1 tablet (10 mg total) by mouth daily. 05/25/19  Yes Baity, Salvadore Oxford, NP  losartan (COZAAR) 50 MG tablet Take 1 tablet (50 mg total) by mouth daily. 06/28/18  Yes Baity, Salvadore Oxford, NP  mometasone (ELOCON) 0.1 % ointment Apply topically daily. 05/25/19  Yes Lorre Munroe, NP    Family History Family History  Problem Relation Age of Onset  . Hypertension Mother   . Thyroid disease Mother   . Heart disease Father   . Stomach cancer Maternal Grandmother   . Prostate cancer Maternal Grandfather   . Throat cancer Paternal Aunt   . Colon cancer Neg Hx   . Colon polyps Neg Hx   . Kidney disease Neg Hx   . Esophageal cancer Neg Hx   . Gallbladder disease Neg Hx     Social History Social History   Tobacco Use  . Smoking status: Former Smoker      Packs/day: 1.50    Years: 8.00    Pack years: 12.00    Types: Cigarettes    Quit date: 06/16/2012    Years since quitting: 6.9  . Smokeless tobacco: Never Used  Substance Use Topics  . Alcohol use: Yes    Alcohol/week: 1.0 standard drinks    Types: 1 Cans of beer per week    Comment: occasional  . Drug use: No     Allergies   Shrimp [shellfish allergy]   Review of Systems Review of Systems  Constitutional: Negative for chills and fever.  HENT: Positive for congestion and sore throat. Negative for ear pain and trouble swallowing.   Eyes: Negative for pain and visual disturbance.  Respiratory: Positive for cough. Negative for shortness of breath.   Cardiovascular: Negative for chest pain and palpitations.  Gastrointestinal: Negative for abdominal pain, diarrhea, nausea and vomiting.  Genitourinary: Negative for dysuria and hematuria.  Musculoskeletal: Negative for arthralgias and back pain.  Skin: Negative for color change and rash.  Neurological: Negative for seizures and syncope.  All other systems reviewed and are negative.    Physical Exam Triage Vital Signs ED Triage Vitals [06/14/19 1631]  Enc Vitals Group     BP      Pulse  Resp      Temp      Temp src      SpO2      Weight      Height      Head Circumference      Peak Flow      Pain Score 0     Pain Loc      Pain Edu?      Excl. in GC?    No data found.  Updated Vital Signs BP 129/75   Pulse (!) 103   Temp 98 F (36.7 C)   Resp 18   SpO2 98%   Visual Acuity Right Eye Distance:   Left Eye Distance:   Bilateral Distance:    Right Eye Near:   Left Eye Near:    Bilateral Near:     Physical Exam Vitals and nursing note reviewed.  Constitutional:      General: He is not in acute distress.    Appearance: He is well-developed. He is not ill-appearing.  HENT:     Head: Normocephalic and atraumatic.     Right Ear: Tympanic membrane normal.     Left Ear: Tympanic membrane normal.      Nose: Nose normal.     Mouth/Throat:     Mouth: Mucous membranes are moist.     Pharynx: Oropharynx is clear. No oropharyngeal exudate or posterior oropharyngeal erythema.  Eyes:     Conjunctiva/sclera: Conjunctivae normal.  Cardiovascular:     Rate and Rhythm: Normal rate and regular rhythm.     Heart sounds: No murmur.  Pulmonary:     Effort: Pulmonary effort is normal. No respiratory distress.     Breath sounds: Normal breath sounds.  Abdominal:     General: Bowel sounds are normal.     Palpations: Abdomen is soft.     Tenderness: There is no abdominal tenderness. There is no guarding or rebound.  Musculoskeletal:     Cervical back: Neck supple.  Skin:    General: Skin is warm and dry.     Findings: No rash.  Neurological:     General: No focal deficit present.     Mental Status: He is alert and oriented to person, place, and time.  Psychiatric:        Mood and Affect: Mood normal.        Behavior: Behavior normal.      UC Treatments / Results  Labs (all labs ordered are listed, but only abnormal results are displayed) Labs Reviewed  POC SARS CORONAVIRUS 2 AG -  ED    EKG   Radiology No results found.  Procedures Procedures (including critical care time)  Medications Ordered in UC Medications - No data to display  Initial Impression / Assessment and Plan / UC Course  I have reviewed the triage vital signs and the nursing notes.  Pertinent labs & imaging results that were available during my care of the patient were reviewed by me and considered in my medical decision making (see chart for details).    Cough, exposure to COVID.  POC COVID negative; PCR pending.  Instructed patient to self quarantine until the test result is back.  Instructed patient to go to the emergency department if he develops high fever, shortness of breath, severe diarrhea, or other concerning symptoms.  Patient agrees with plan of care.    Final Clinical Impressions(s) / UC  Diagnoses   Final diagnoses:  Cough  Exposure to COVID-19 virus  Discharge Instructions     Your rapid COVID test is negative; the send-out test is pending.  You should self quarantine until your test result is back and is negative.    Go to the emergency department if you develop high fever, shortness of breath, severe diarrhea, or other concerning symptoms.         ED Prescriptions    None     PDMP not reviewed this encounter.   Sharion Balloon, NP 06/14/19 [phone]

## 2019-06-15 ENCOUNTER — Other Ambulatory Visit: Payer: Managed Care, Other (non HMO)

## 2019-06-15 DIAGNOSIS — E782 Mixed hyperlipidemia: Secondary | ICD-10-CM

## 2019-06-16 ENCOUNTER — Telehealth (HOSPITAL_COMMUNITY): Payer: Self-pay | Admitting: Emergency Medicine

## 2019-06-16 LAB — NOVEL CORONAVIRUS, NAA: SARS-CoV-2, NAA: DETECTED — AB

## 2019-06-16 NOTE — Telephone Encounter (Signed)

## 2019-07-05 ENCOUNTER — Other Ambulatory Visit: Payer: Self-pay | Admitting: Internal Medicine

## 2019-07-11 ENCOUNTER — Encounter: Payer: Self-pay | Admitting: Internal Medicine

## 2019-07-11 ENCOUNTER — Other Ambulatory Visit: Payer: Self-pay

## 2019-07-11 ENCOUNTER — Ambulatory Visit (INDEPENDENT_AMBULATORY_CARE_PROVIDER_SITE_OTHER): Payer: Managed Care, Other (non HMO) | Admitting: Internal Medicine

## 2019-07-11 DIAGNOSIS — R509 Fever, unspecified: Secondary | ICD-10-CM | POA: Diagnosis not present

## 2019-07-11 DIAGNOSIS — U071 COVID-19: Secondary | ICD-10-CM | POA: Diagnosis not present

## 2019-07-11 DIAGNOSIS — R05 Cough: Secondary | ICD-10-CM | POA: Diagnosis not present

## 2019-07-11 DIAGNOSIS — R059 Cough, unspecified: Secondary | ICD-10-CM

## 2019-07-11 DIAGNOSIS — R519 Headache, unspecified: Secondary | ICD-10-CM | POA: Diagnosis not present

## 2019-07-11 MED ORDER — AZITHROMYCIN 250 MG PO TABS: ORAL_TABLET | ORAL | 0 refills | Status: DC

## 2019-07-11 NOTE — Progress Notes (Signed)
Virtual Visit via Video Note  I connected with Alexander Navarro on 07/11/19 at  3:45 PM EST by a video enabled telemedicine application and verified that I am speaking with the correct person using two identifiers.  Location: Patient: Home Provider: Office   I discussed the limitations of evaluation and management by telemedicine and the availability of in person appointments. The patient expressed understanding and agreed to proceed.  History of Present Illness:  Pt reports cough, fever, chills, and headache. This started 1 week ago. He reports productive cough with green sputum, intermittent headaches and a fever with chills. The headache is located in his forehead, he describes the pain as pressure. He has tried Tylenol and Mucinex with some relief. He tested positive for COVID 06/14/2019. After quarantining and returning to work these new symptoms began. He reports several coworkers have URI symptoms. He denies SOB, ear fullness, fatigue, NVD, or loss of taste or smell.   Past Medical History:  Diagnosis Date  . Fatty liver   . Frequent headaches   . GERD (gastroesophageal reflux disease)   . Obesity     Current Outpatient Medications  Medication Sig Dispense Refill  . amLODipine (NORVASC) 10 MG tablet TAKE 1 TABLET(10 MG) BY MOUTH DAILY 30 tablet 0  . losartan (COZAAR) 50 MG tablet Take 1 tablet (50 mg total) by mouth daily. 90 tablet 3  . mometasone (ELOCON) 0.1 % ointment Apply topically daily. 45 g 0   No current facility-administered medications for this visit.    Allergies  Allergen Reactions  . Shrimp [Shellfish Allergy] Nausea And Vomiting    Family History  Problem Relation Age of Onset  . Hypertension Mother   . Thyroid disease Mother   . Heart disease Father   . Stomach cancer Maternal Grandmother   . Prostate cancer Maternal Grandfather   . Throat cancer Paternal Aunt   . Colon cancer Neg Hx   . Colon polyps Neg Hx   . Kidney disease Neg Hx   . Esophageal  cancer Neg Hx   . Gallbladder disease Neg Hx     Social History   Socioeconomic History  . Marital status: Married    Spouse name: Not on file  . Number of children: 0  . Years of education: Not on file  . Highest education level: Not on file  Occupational History  . 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 Smoking status: Former Smoker    Packs/day: 1.50    Years: 8.00    Pack years: 12.00    Types: Cigarettes    Quit date: 06/16/2012    Years since quitting: 7.0  . Smokeless tobacco: Never Used  Substance and Sexual Activity  . Alcohol use: Yes    Alcohol/week: 1.0 standard drinks    Types: 1 Cans of beer per week    Comment: occasional  . Drug use: No  . Sexual activity: Yes  Other Topics Concern  . Not on file  Social History Narrative  . Not on file   Social Determinants of Health   Financial Resource Strain:   . Difficulty of Paying Living Expenses: Not on file  Food Insecurity:   . Worried About Programme researcher, broadcasting/film/video in the Last Year: Not on file  . Ran Out of Food in the Last Year: Not on file  Transportation Needs:   . Lack of Transportation (Medical): Not on file  . Lack of Transportation (Non-Medical): Not on file  Physical Activity:   .  Days of Exercise per Week: Not on file  . Minutes of Exercise per Session: Not on file  Stress:   . Feeling of Stress : Not on file  Social Connections:   . Frequency of Communication with Friends and Family: Not on file  . Frequency of Social Gatherings with Friends and Family: Not on file  . Attends Religious Services: Not on file  . Active Member of Clubs or Organizations: Not on file  . Attends Archivist Meetings: Not on file  . Marital Status: Not on file  Intimate Partner Violence:   . Fear of Current or Ex-Partner: Not on file  . Emotionally Abused: Not on file  . Physically Abused: Not on file  . Sexually Abused: Not on file     Constitutional: Pt reports fever and headaches. Denies  malaise, fatigue, or abrupt weight changes.  HEENT: Denies eye pain, eye redness, ear pain, ringing in the ears, wax buildup, runny nose, bloody nose, or sore throat. Respiratory: Pt reports cough with green sputum production. Denies difficulty breathing or shortness of breath.    Cardiovascular: Denies chest pain, chest tightness, palpitations or swelling in the hands or feet.  Gastrointestinal: Denies abdominal pain, bloating, constipation, diarrhea or blood in the stool.   No other specific complaints in a complete review of systems (except as listed in HPI above).    Observations/Objective:    Wt Readings from Last 3 Encounters:  05/25/19 (!) 322 lb (146.1 kg)  04/05/19 (!) 322 lb (146.1 kg)  07/14/18 299 lb (135.6 kg)    General: Appears this stated age, obese, in NAD. HEENT: Head: normal shape and size. Nose: does not sound congested; Throat: does not sound hoarse. Pulmonary/Chest: Normal effort. No respiratory distress. Neurological: Alert and oriented.     BMET    Component Value Date/Time   NA 136 05/25/2019 1505   K 4.4 05/25/2019 1505   CL 100 05/25/2019 1505   CO2 28 05/25/2019 1505   GLUCOSE 72 05/25/2019 1505   BUN 16 05/25/2019 1505   CREATININE 0.82 05/25/2019 1505   CALCIUM 9.8 05/25/2019 1505   GFRNONAA >90 10/12/2013 0114   GFRAA >90 10/12/2013 0114    Lipid Panel     Component Value Date/Time   CHOL 203 (H) 05/25/2019 1505   TRIG (H) 05/25/2019 1505    462.0 Triglyceride is over 400; calculations on Lipids are invalid.   HDL 33.30 (L) 05/25/2019 1505   CHOLHDL 6 05/25/2019 1505   VLDL 21.6 08/29/2013 1104   LDLCALC 96 01/27/2015 0000    CBC    Component Value Date/Time   WBC 9.7 05/25/2019 1505   RBC 6.00 (H) 05/25/2019 1505   HGB 16.5 05/25/2019 1505   HCT 49.1 05/25/2019 1505   PLT 161.0 05/25/2019 1505   MCV 81.9 05/25/2019 1505   MCH 27.9 08/25/2014 1828   MCHC 33.5 05/25/2019 1505   RDW 13.6 05/25/2019 1505   LYMPHSABS 2.4  08/25/2014 1828   MONOABS 0.5 08/25/2014 1828   EOSABS 0.1 08/25/2014 1828   BASOSABS 0.0 08/25/2014 1828    Hgb A1C Lab Results  Component Value Date   HGBA1C 5.6 05/25/2019       Assessment and Plan:  Acute Headache, Cough, Fever secondary to COVID 19:  RX for Azithromycin 250mg  po BID x1 day, QD x4 days Advised to continue Mucinex and Tylenol PRN Discussed symptomatic care: rest, fluids, Tylenol/Ibuprofen OTC, cough syrup OTC  Return precautions discussed  Follow Up Instructions:  I discussed the assessment and treatment plan with the patient. The patient was provided an opportunity to ask questions and all were answered. The patient agreed with the plan and demonstrated an understanding of the instructions.   The patient was advised to call back or seek an in-person evaluation if the symptoms worsen or if the condition fails to improve as anticipated.    Nicki Reaper, NP

## 2019-07-11 NOTE — Patient Instructions (Signed)

## 2019-07-15 ENCOUNTER — Telehealth: Payer: Self-pay

## 2019-07-15 NOTE — Telephone Encounter (Signed)
It is likely that he will continue to have symptoms. He should treat them symptomatically. Tylenol for fever, Zyrtec and Flonase for nasal congestion and cough. He can take Delsym for cough. If symptoms worsen over the weekend, he may want to go to an urgent care for further evaluation.

## 2019-07-15 NOTE — Telephone Encounter (Signed)
Pt finished his Zpac today. He is still running a fever, nasal congestion, cough. Just now temp was 100.8 He is asking what should he do?

## 2019-07-15 NOTE — Telephone Encounter (Signed)
Pt is aware as instructed and expressed understanding 

## 2019-07-21 ENCOUNTER — Other Ambulatory Visit: Payer: Self-pay | Admitting: Internal Medicine

## 2019-08-01 ENCOUNTER — Other Ambulatory Visit: Payer: Self-pay

## 2019-08-01 MED ORDER — LOSARTAN POTASSIUM 50 MG PO TABS: 50.0000 mg | ORAL_TABLET | Freq: Every day | ORAL | 0 refills | Status: DC

## 2019-08-15 ENCOUNTER — Other Ambulatory Visit: Payer: Self-pay | Admitting: Internal Medicine

## 2019-08-22 ENCOUNTER — Telehealth: Payer: Self-pay | Admitting: Internal Medicine

## 2019-08-22 NOTE — Telephone Encounter (Signed)
Patient's wife called. She stated that the pharmacy is needing the patient's amLODipine 10mg  sent over for a 90 day supply so the insurance will cover. They will not cover a 30 day supply

## 2019-08-23 NOTE — Telephone Encounter (Signed)
Pt is overdue for f/u recheck of BP

## 2019-08-24 MED ORDER — LOSARTAN POTASSIUM 50 MG PO TABS: 50.0000 mg | ORAL_TABLET | Freq: Every day | ORAL | 0 refills | Status: DC

## 2019-08-24 MED ORDER — AMLODIPINE BESYLATE 10 MG PO TABS: 10.0000 mg | ORAL_TABLET | Freq: Every day | ORAL | 0 refills | Status: DC

## 2019-08-24 NOTE — Telephone Encounter (Signed)
Patient called about refill. Advised that he was due for a f/u for his BP/.   Patient did schedule appointment for tomorrow but he stated that he has not taken the medication for a week now.

## 2019-08-24 NOTE — Addendum Note (Signed)
Addended by: Roena Malady on: 08/24/2019 11:59 AM   Modules accepted: Orders

## 2019-08-24 NOTE — Telephone Encounter (Signed)
Rx sent through [facility]  If pt has been out of meds x 1 week, it would not be helpful to have appt tomorrow, it should be next week

## 2019-08-25 ENCOUNTER — Ambulatory Visit: Payer: Managed Care, Other (non HMO) | Admitting: Internal Medicine

## 2019-09-01 ENCOUNTER — Ambulatory Visit: Payer: Managed Care, Other (non HMO) | Admitting: Internal Medicine

## 2019-09-01 ENCOUNTER — Other Ambulatory Visit: Payer: Self-pay

## 2019-09-01 ENCOUNTER — Encounter: Payer: Self-pay | Admitting: Internal Medicine

## 2019-09-01 DIAGNOSIS — I10 Essential (primary) hypertension: Secondary | ICD-10-CM | POA: Diagnosis not present

## 2019-09-01 NOTE — Progress Notes (Signed)
Subjective:    Patient ID: Alexander Navarro, male    DOB: [birth year], 35 y.o.   MRN: [id number]  HPI  Pt presents to the clinic today for 2 week follow up of HTN. At his last visit, he was started on Amlodipine in addition to his Losartan. He has been taking the medication as prescribed and denies adverse side effects. His BP today is 128/84. ECG from reviewed.  Review of Systems      Past Medical History:  Diagnosis Date  . Fatty liver   . Frequent headaches   . GERD (gastroesophageal reflux disease)   . Obesity     Current Outpatient Medications  Medication Sig Dispense Refill  . amLODipine (NORVASC) 10 MG tablet Take 1 tablet (10 mg total) by mouth daily. 90 tablet 0  . azithromycin (ZITHROMAX) 250 MG tablet Take 2 tabs today, then 1 tab daily x 4 days 6 tablet 0  . losartan (COZAAR) 50 MG tablet Take 1 tablet (50 mg total) by mouth daily. 90 tablet 0  . mometasone (ELOCON) 0.1 % ointment Apply topically daily. 45 g 0   No current facility-administered medications for this visit.    Allergies  Allergen Reactions  . Shrimp [Shellfish Allergy] Nausea And Vomiting    Family History  Problem Relation Age of Onset  . Hypertension Mother   . Thyroid disease Mother   . Heart disease Father   . Stomach cancer Maternal Grandmother   . Prostate cancer Maternal Grandfather   . Throat cancer Paternal Aunt   . Colon cancer Neg Hx   . Colon polyps Neg Hx   . Kidney disease Neg Hx   . Esophageal cancer Neg Hx   . Gallbladder disease Neg Hx     Social History   Socioeconomic History  . Marital status: Married    Spouse name: Not on file  . Number of children: 0  . Years of education: Not on file  . Highest education level: Not on file  Occupational History  . 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 Smoking status: Former Smoker    Packs/day: 1.50    Years: 8.00    Pack years: 12.00    Types: Cigarettes    Quit date: 06/16/2012    Years since quitting: 7.2    . Smokeless tobacco: Never Used  Substance and Sexual Activity  . Alcohol use: Yes    Alcohol/week: 1.0 standard drinks    Types: 1 Cans of beer per week    Comment: occasional  . Drug use: No  . Sexual activity: Yes  Other Topics Concern  . Not on file  Social History Narrative  . Not on file   Social Determinants of Health   Financial Resource Strain:   . Difficulty of Paying Living Expenses:   Food Insecurity:   . Worried About Charity fundraiser in the Last Year:   . Arboriculturist in the Last Year:   Transportation Needs:   . Film/video editor (Medical):   Marland Kitchen Lack of Transportation (Non-Medical):   Physical Activity:   . Days of Exercise per Week:   . Minutes of Exercise per Session:   Stress:   . Feeling of Stress :   Social Connections:   . Frequency of Communication with Friends and Family:   . Frequency of Social Gatherings with Friends and Family:   . Attends Religious Services:   . Active Member of Clubs or Organizations:   .  Attends Banker Meetings:   Marland Kitchen Marital Status:   Intimate Partner Violence:   . Fear of Current or Ex-Partner:   . Emotionally Abused:   Marland Kitchen Physically Abused:   . Sexually Abused:      Constitutional: Denies fever, malaise, fatigue, headache or abrupt weight changes.  Respiratory: Denies difficulty breathing, shortness of breath, cough or sputum production.   Cardiovascular: Denies chest pain, chest tightness, palpitations or swelling in the hands or feet.  Neurological: Denies dizziness, difficulty with memory, difficulty with speech or problems with balance and coordination.    No other specific complaints in a complete review of systems (except as listed in HPI above).  Objective:   Physical Exam  BP 128/84   Pulse 93   Temp 97.9 F (36.6 C) (Temporal)   Wt (!) 320 lb (145.2 kg)   SpO2 98%   BMI 42.22 kg/m   Wt Readings from Last 3 Encounters:  05/25/19 (!) 322 lb (146.1 kg)  04/05/19 (!) 322 lb  (146.1 kg)  07/14/18 299 lb (135.6 kg)    General: Appears his stated age, obese, in NAD. Cardiovascular: Normal rate and rhythm. S1,S2 noted.  No murmur, rubs or gallops noted. No JVD or BLE edema.  Pulmonary/Chest: Normal effort and positive vesicular breath sounds. No respiratory distress. No wheezes, rales or ronchi noted.  Neurological: Alert and oriented.     BMET    Component Value Date/Time   NA 136 05/25/2019 1505   K 4.4 05/25/2019 1505   CL 100 05/25/2019 1505   CO2 28 05/25/2019 1505   GLUCOSE 72 05/25/2019 1505   BUN 16 05/25/2019 1505   CREATININE 0.82 05/25/2019 1505   CALCIUM 9.8 05/25/2019 1505   GFRNONAA >90 10/12/2013 0114   GFRAA >90 10/12/2013 0114    Lipid Panel     Component Value Date/Time   CHOL 203 (H) 05/25/2019 1505   TRIG (H) 05/25/2019 1505    462.0 Triglyceride is over 400; calculations on Lipids are invalid.   HDL 33.30 (L) 05/25/2019 1505   CHOLHDL 6 05/25/2019 1505   VLDL 21.6 08/29/2013 1104   LDLCALC 96 01/27/2015 0000    CBC    Component Value Date/Time   WBC 9.7 05/25/2019 1505   RBC 6.00 (H) 05/25/2019 1505   HGB 16.5 05/25/2019 1505   HCT 49.1 05/25/2019 1505   PLT 161.0 05/25/2019 1505   MCV 81.9 05/25/2019 1505   MCH 27.9 08/25/2014 1828   MCHC 33.5 05/25/2019 1505   RDW 13.6 05/25/2019 1505   LYMPHSABS 2.4 08/25/2014 1828   MONOABS 0.5 08/25/2014 1828   EOSABS 0.1 08/25/2014 1828   BASOSABS 0.0 08/25/2014 1828    Hgb A1C Lab Results  Component Value Date   HGBA1C 5.6 05/25/2019            Assessment & Plan:    Nicki Reaper, NP This visit occurred during the SARS-CoV-2 public health emergency.  Safety protocols were in place, including screening questions prior to the visit, additional usage of staff PPE, and extensive cleaning of exam room while observing appropriate contact time as indicated for disinfecting solutions.

## 2019-09-01 NOTE — Patient Instructions (Signed)

## 2019-09-01 NOTE — Assessment & Plan Note (Signed)
Controlled on Amlodipine and Losartan Will continue to monitor

## 2019-11-25 ENCOUNTER — Other Ambulatory Visit: Payer: Self-pay | Admitting: Internal Medicine

## 2019-11-25 NOTE — Telephone Encounter (Signed)
Patient's wife called about refill request She stated the pharmacy has not be able to get in contact with the office to refill medication  Patient is going out of town and would like to pick this up as soon as he can    Advised wife that refill request was received today from pharmacy

## 2019-11-25 NOTE — Telephone Encounter (Signed)
Rx sent through [facility]  

## 2020-02-02 ENCOUNTER — Other Ambulatory Visit: Payer: Self-pay | Admitting: Internal Medicine

## 2020-02-24 ENCOUNTER — Telehealth: Payer: Self-pay | Admitting: Internal Medicine

## 2020-02-24 MED ORDER — AMLODIPINE BESYLATE 10 MG PO TABS: ORAL_TABLET | ORAL | 0 refills | Status: DC

## 2020-02-24 NOTE — Telephone Encounter (Signed)
Spouse called to schedule cpx 11/10 and pt needs refill for amlodipine  Needs for 90 days so insurance will pay for  Pt has 3 pills left

## 2020-02-29 ENCOUNTER — Other Ambulatory Visit: Payer: Self-pay | Admitting: Internal Medicine

## 2020-04-25 ENCOUNTER — Encounter: Payer: Managed Care, Other (non HMO) | Admitting: Internal Medicine

## 2020-06-06 ENCOUNTER — Telehealth: Payer: Self-pay | Admitting: Internal Medicine

## 2020-06-06 NOTE — Telephone Encounter (Signed)
Pt wife called in wanted to know about getting a prescription for Losartin and Amlodipine for 90 to be covered by insurance.  [facility]:[address]

## 2020-06-07 MED ORDER — AMLODIPINE BESYLATE 10 MG PO TABS: 10.0000 mg | ORAL_TABLET | Freq: Every day | ORAL | 0 refills | Status: DC

## 2020-06-07 MED ORDER — LOSARTAN POTASSIUM 50 MG PO TABS: 50.0000 mg | ORAL_TABLET | Freq: Every day | ORAL | 0 refills | Status: DC

## 2020-06-27 ENCOUNTER — Ambulatory Visit (INDEPENDENT_AMBULATORY_CARE_PROVIDER_SITE_OTHER): Payer: Managed Care, Other (non HMO) | Admitting: Internal Medicine

## 2020-06-27 ENCOUNTER — Other Ambulatory Visit: Payer: Self-pay

## 2020-06-27 ENCOUNTER — Encounter: Payer: Self-pay | Admitting: Internal Medicine

## 2020-06-27 VITALS — BP 148/86 | HR 95 | Temp 97.7°F | Ht 73.0 in | Wt 288.0 lb

## 2020-06-27 DIAGNOSIS — I10 Essential (primary) hypertension: Secondary | ICD-10-CM | POA: Diagnosis not present

## 2020-06-27 DIAGNOSIS — F5101 Primary insomnia: Secondary | ICD-10-CM | POA: Diagnosis not present

## 2020-06-27 DIAGNOSIS — Z1159 Encounter for screening for other viral diseases: Secondary | ICD-10-CM

## 2020-06-27 DIAGNOSIS — Z0001 Encounter for general adult medical examination with abnormal findings: Secondary | ICD-10-CM

## 2020-06-27 DIAGNOSIS — L301 Dyshidrosis [pompholyx]: Secondary | ICD-10-CM

## 2020-06-27 DIAGNOSIS — F419 Anxiety disorder, unspecified: Secondary | ICD-10-CM

## 2020-06-27 DIAGNOSIS — Z114 Encounter for screening for human immunodeficiency virus [HIV]: Secondary | ICD-10-CM

## 2020-06-27 DIAGNOSIS — K219 Gastro-esophageal reflux disease without esophagitis: Secondary | ICD-10-CM | POA: Diagnosis not present

## 2020-06-27 MED ORDER — TRAZODONE HCL 50 MG PO TABS: 25.0000 mg | ORAL_TABLET | Freq: Every evening | ORAL | 0 refills | Status: DC | PRN

## 2020-06-27 NOTE — Progress Notes (Signed)
Subjective:    Patient ID: Alexander Navarro, male    DOB: June 21, 1985, 34 y.o.   MRN: [id number]  HPI  Patient presents the clinic today for his annual exam.  He is also due to follow-up chronic conditions.  HTN: His BP today is 148/86.  He is taking Losartan and Amlodipine as prescribed.  ECG from 08/2014 reviewed.  GERD: Triggered by spicy and tomato-based food.  He takes Tums as needed with good relief of symptoms.  Upper GI from 06/2014 reviewed.  Dyshidrotic Eczema: Managed with mometasone ointmentM  Insomnia: He has difficulty falling asleep and staying asleep.  He is not taking any prescription or OTC medication for this.  There is no sleep study on file.   Flu: 03/2017 Tetanus: 10/2014 COVID: never Dentist: as needed  Diet: He does eat meat. He consumes some fruits and veggies. He does eat some fried foods. He drinks mostly Dt. Mt. Dew, some water. Exercise: None  Review of Systems      Past Medical History:  Diagnosis Date  . Fatty liver   . Frequent headaches   . GERD (gastroesophageal reflux disease)   . Obesity     Current Outpatient Medications  Medication Sig Dispense Refill  . amLODipine (NORVASC) 10 MG tablet Take 1 tablet (10 mg total) by mouth daily. LAST REFILL MUST SCHEDULE PHYSICAL 90 tablet 0  . losartan (COZAAR) 50 MG tablet Take 1 tablet (50 mg total) by mouth daily. SCHEDULE PHYSICAL EXAM FOR REFILLS 90 tablet 0  . mometasone (ELOCON) 0.1 % ointment Apply topically daily. 45 g 0   No current facility-administered medications for this visit.    Allergies  Allergen Reactions  . Shrimp [Shellfish Allergy] Nausea And Vomiting    Family History  Problem Relation Age of Onset  . Hypertension Mother   . Thyroid disease Mother   . Heart disease Father   . Stomach cancer Maternal Grandmother   . Prostate cancer Maternal Grandfather   . Throat cancer Paternal Aunt   . Colon cancer Neg Hx   . Colon polyps Neg Hx   . Kidney disease Neg Hx   .  Esophageal cancer Neg Hx   . Gallbladder disease Neg Hx     Social History   Socioeconomic History  . Marital status: Married    Spouse name: Not on file  . Number of children: 0  . Years of education: Not on file  . Highest education level: Not on file  Occupational History  . 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 Smoking status: Former Smoker    Packs/day: 1.50    Years: 8.00    Pack years: 12.00    Types: Cigarettes    Quit date: 06/16/2012    Years since quitting: 8.0  . Smokeless tobacco: Never Used  Substance and Sexual Activity  . Alcohol use: Yes    Alcohol/week: 1.0 standard drink    Types: 1 Cans of beer per week    Comment: occasional  . Drug use: No  . Sexual activity: Yes  Other Topics Concern  . Not on file  Social History Narrative  . Not on file   Social Determinants of Health   Financial Resource Strain: Not on file  Food Insecurity: Not on file  Transportation Needs: Not on file  Physical Activity: Not on file  Stress: Not on file  Social Connections: Not on file  Intimate Partner Violence: Not on file     Constitutional: Denies  fever, malaise, fatigue, headache or abrupt weight changes.  HEENT: Denies eye pain, eye redness, ear pain, ringing in the ears, wax buildup, runny nose, nasal congestion, bloody nose, or sore throat. Respiratory: Denies difficulty breathing, shortness of breath, cough or sputum production.   Cardiovascular: Denies chest pain, chest tightness, palpitations or swelling in the hands or feet.  Gastrointestinal: Denies abdominal pain, bloating, constipation, diarrhea or blood in the stool.  GU: Denies urgency, frequency, pain with urination, burning sensation, blood in urine, odor or discharge. Musculoskeletal: Denies decrease in range of motion, difficulty with gait, muscle pain or joint pain and swelling.  Skin: Denies redness, rashes, lesions or ulcercations.  Neurological: Patient reports insomnia.  Denies  dizziness, difficulty with memory, difficulty with speech or problems with balance and coordination.  Psych: Patient reports anxiety.  Denies depression, SI/HI.  No other specific complaints in a complete review of systems (except as listed in HPI above).  Objective:   Physical Exam  BP (!) 148/86   Pulse 95   Temp 97.7 F (36.5 C) (Temporal)   Ht 6' 1"  (1.854 m)   Wt 288 lb (130.6 kg)   SpO2 97%   BMI 38.00 kg/m   Wt Readings from Last 3 Encounters:  09/01/19 (!) 320 lb (145.2 kg)  05/25/19 (!) 322 lb (146.1 kg)  04/05/19 (!) 322 lb (146.1 kg)    General: Appears his stated age, obese, in NAD. Skin: Hyperpigmented rash noted on bilateral hands and bilateral elbows. HEENT: Head: normal shape and size; Eyes: sclera white, no icterus, conjunctiva pink, PERRLA and EOMs intact; Neck:  Neck supple, trachea midline. No masses, lumps or thyromegaly present.  Cardiovascular: Normal rate and rhythm. S1,S2 noted.  No murmur, rubs or gallops noted. No JVD or BLE edema.  Pulmonary/Chest: Normal effort and positive vesicular breath sounds. No respiratory distress. No wheezes, rales or ronchi noted.  Abdomen: Soft and nontender. Normal bowel sounds. No distention or masses noted. Liver, spleen and kidneys non palpable. Musculoskeletal: Strength 5/5 BUE/BLE no difficulty with gait.  Neurological: Alert and oriented. Cranial nerves II-XII grossly intact. Coordination normal.  Psychiatric: Mood and affect normal. Behavior is normal. Judgment and thought content normal.     BMET    Component Value Date/Time   NA 136 05/25/2019 1505   K 4.4 05/25/2019 1505   CL 100 05/25/2019 1505   CO2 28 05/25/2019 1505   GLUCOSE 72 05/25/2019 1505   BUN 16 05/25/2019 1505   CREATININE 0.82 05/25/2019 1505   CALCIUM 9.8 05/25/2019 1505   GFRNONAA >90 10/12/2013 0114   GFRAA >90 10/12/2013 0114    Lipid Panel     Component Value Date/Time   CHOL 203 (H) 05/25/2019 1505   TRIG (H) 05/25/2019 1505     462.0 Triglyceride is over 400; calculations on Lipids are invalid.   HDL 33.30 (L) 05/25/2019 1505   CHOLHDL 6 05/25/2019 1505   VLDL 21.6 08/29/2013 1104   LDLCALC 96 01/27/2015 0000    CBC    Component Value Date/Time   WBC 9.7 05/25/2019 1505   RBC 6.00 (H) 05/25/2019 1505   HGB 16.5 05/25/2019 1505   HCT 49.1 05/25/2019 1505   PLT 161.0 05/25/2019 1505   MCV 81.9 05/25/2019 1505   MCH 27.9 08/25/2014 1828   MCHC 33.5 05/25/2019 1505   RDW 13.6 05/25/2019 1505   LYMPHSABS 2.4 08/25/2014 1828   MONOABS 0.5 08/25/2014 1828   EOSABS 0.1 08/25/2014 1828   BASOSABS 0.0 08/25/2014 1828  Hgb A1C Lab Results  Component Value Date   HGBA1C 5.6 05/25/2019            Assessment & Plan:   Preventative Health Maintenance:  He declines flu shot Tetanus UTD He declines COVID-vaccine Encouraged him to consume a balanced diet and exercise regimen Advised him to see an eye doctor and dentist annually We will check CBC, c-Met, TSH, lipid, A1c, HIV and hep C today  RTC in 1 year, sooner if needed Webb Silversmith, NP This visit occurred during the SARS-CoV-2 public health emergency.  Safety protocols were in place, including screening questions prior to the visit, additional usage of staff PPE, and extensive cleaning of exam room while observing appropriate contact time as indicated for disinfecting solutions.

## 2020-06-28 LAB — LIPID PANEL
Cholesterol: 184 mg/dL (ref 0–200)
HDL: 41.3 mg/dL (ref >39.00)
LDL Cholesterol: 110 mg/dL — ABNORMAL HIGH (ref 0–99)
NonHDL: 143.02
Total CHOL/HDL Ratio: 4
Triglycerides: 164 mg/dL — ABNORMAL HIGH (ref 0.0–149.0)
VLDL: 32.8 mg/dL (ref 0.0–40.0)

## 2020-06-28 LAB — COMPREHENSIVE METABOLIC PANEL
ALT: 23 U/L (ref 0–53)
AST: 17 U/L (ref 0–37)
Albumin: 5.1 g/dL (ref 3.5–5.2)
Alkaline Phosphatase: 53 U/L (ref 39–117)
BUN: 13 mg/dL (ref 6–23)
CO2: 30 mEq/L (ref 19–32)
Calcium: 10.1 mg/dL (ref 8.4–10.5)
Chloride: 101 mEq/L (ref 96–112)
Creatinine, Ser: 0.9 mg/dL (ref 0.40–1.50)
GFR: 112.47 mL/min (ref >60.00)
Glucose, Bld: 87 mg/dL (ref 70–99)
Potassium: 4.4 mEq/L (ref 3.5–5.1)
Sodium: 137 mEq/L (ref 135–145)
Total Bilirubin: 0.4 mg/dL (ref 0.2–1.2)
Total Protein: 7.4 g/dL (ref 6.0–8.3)

## 2020-06-28 LAB — CBC
HCT: 50.4 % (ref 39.0–52.0)
Hemoglobin: 17 g/dL (ref 13.0–17.0)
MCHC: 33.7 g/dL (ref 30.0–36.0)
MCV: 81.7 fl (ref 78.0–100.0)
Platelets: 170 10*3/uL (ref 150.0–400.0)
RBC: 6.17 Mil/uL — ABNORMAL HIGH (ref 4.22–5.81)
RDW: 13.6 % (ref 11.5–15.5)
WBC: 11.9 10*3/uL — ABNORMAL HIGH (ref 4.0–10.5)

## 2020-06-28 LAB — HEMOGLOBIN A1C: Hgb A1c MFr Bld: 5.8 % (ref 4.6–6.5)

## 2020-06-28 LAB — HIV ANTIBODY (ROUTINE TESTING W REFLEX): HIV 1&2 Ab, 4th Generation: NONREACTIVE

## 2020-06-28 LAB — TSH: TSH: 2.04 u[IU]/mL (ref 0.35–4.50)

## 2020-06-28 LAB — HEPATITIS C ANTIBODY
Hepatitis C Ab: NONREACTIVE
SIGNAL TO CUT-OFF: 0.01 (ref <1.00)

## 2020-06-28 NOTE — Patient Instructions (Signed)

## 2020-06-28 NOTE — Assessment & Plan Note (Signed)
Versus psoriasis Continue Mometasone ointment as prescribed Could consider Clobetasol if no improvement in symptoms He declines referral to dermatology at this time

## 2020-06-28 NOTE — Assessment & Plan Note (Signed)
I wonder if his anxiety is related to him not sleeping well Rx for Trazodone 25 to 50 mg nightly as needed-sedation caution given Update me in 1 month and let me know how this is working.

## 2020-06-28 NOTE — Assessment & Plan Note (Signed)
Continue Tums as needed Avoid foods that trigger your reflux Weight loss can help reduce reflux symptoms CBC and c-Met today

## 2020-06-28 NOTE — Assessment & Plan Note (Signed)
Remains elevated despite significant weight loss but has started smoking Increase Losartan to 75 mg daily in addition to his Amlodipine Reinforced DASH diet and exercise for weight loss C met today  RTC in 2 weeks for BP check

## 2020-07-16 ENCOUNTER — Ambulatory Visit: Payer: Managed Care, Other (non HMO) | Admitting: Internal Medicine

## 2020-07-16 DIAGNOSIS — Z0289 Encounter for other administrative examinations: Secondary | ICD-10-CM

## 2020-07-16 NOTE — Progress Notes (Deleted)
Subjective:    Patient ID: Alexander Navarro, male    DOB: 02-Dec-1985, 35 y.o.   MRN: [id number]  HPI  Pt presents to the clinic today for 2 week follow up of HTN. At his last visit, his Losartan was increased to 75 mg daily in addition to his Amlodipine. He has been taking the medication as prescribed and denies adverse side effects. His BP today is. ECG from 08/2014 reviewed.  Review of Systems      Past Medical History:  Diagnosis Date  . Fatty liver   . Frequent headaches   . GERD (gastroesophageal reflux disease)   . Obesity     Current Outpatient Medications  Medication Sig Dispense Refill  . amLODipine (NORVASC) 10 MG tablet Take 1 tablet (10 mg total) by mouth daily. LAST REFILL MUST SCHEDULE PHYSICAL 90 tablet 0  . losartan (COZAAR) 50 MG tablet Take 1.5 tablets (75 mg total) by mouth daily. 135 tablet 0  . mometasone (ELOCON) 0.1 % ointment Apply topically daily. 45 g 0  . traZODone (DESYREL) 50 MG tablet Take 0.5-1 tablets (25-50 mg total) by mouth at bedtime as needed for sleep. 30 tablet 0   No current facility-administered medications for this visit.    Allergies  Allergen Reactions  . Shrimp [Shellfish Allergy] Nausea And Vomiting    Family History  Problem Relation Age of Onset  . Hypertension Mother   . Thyroid disease Mother   . Heart disease Father   . Stomach cancer Maternal Grandmother   . Prostate cancer Maternal Grandfather   . Throat cancer Paternal Aunt   . Colon cancer Neg Hx   . Colon polyps Neg Hx   . Kidney disease Neg Hx   . Esophageal cancer Neg Hx   . Gallbladder disease Neg Hx     Social History   Socioeconomic History  . Marital status: Married    Spouse name: Not on file  . Number of children: 0  . Years of education: Not on file  . Highest education level: Not on file  Occupational History  . 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 Smoking status: Current Every Day Smoker    Packs/day: 1.00    Years: 8.00     Pack years: 8.00    Types: Cigarettes    Last attempt to quit: 06/16/2012    Years since quitting: 8.0  . Smokeless tobacco: Never Used  Substance and Sexual Activity  . Alcohol use: Yes    Alcohol/week: 1.0 standard drink    Types: 1 Cans of beer per week    Comment: occasional  . Drug use: No  . Sexual activity: Yes  Other Topics Concern  . Not on file  Social History Narrative  . Not on file   Social Determinants of Health   Financial Resource Strain: Not on file  Food Insecurity: Not on file  Transportation Needs: Not on file  Physical Activity: Not on file  Stress: Not on file  Social Connections: Not on file  Intimate Partner Violence: Not on file     Constitutional: Denies fever, malaise, fatigue, headache or abrupt weight changes.  HEENT: Denies eye pain, eye redness, ear pain, ringing in the ears, wax buildup, runny nose, nasal congestion, bloody nose, or sore throat. Respiratory: Denies difficulty breathing, shortness of breath, cough or sputum production.   Cardiovascular: Denies chest pain, chest tightness, palpitations or swelling in the hands or feet.  Gastrointestinal: Denies abdominal pain, bloating, constipation,  diarrhea or blood in the stool.  GU: Denies urgency, frequency, pain with urination, burning sensation, blood in urine, odor or discharge. Musculoskeletal: Denies decrease in range of motion, difficulty with gait, muscle pain or joint pain and swelling.  Skin: Denies redness, rashes, lesions or ulcercations.  Neurological: Denies dizziness, difficulty with memory, difficulty with speech or problems with balance and coordination.  Psych: Denies anxiety, depression, SI/HI.  No other specific complaints in a complete review of systems (except as listed in HPI above).  Objective:   Physical Exam   There were no vitals taken for this visit. Wt Readings from Last 3 Encounters:  06/27/20 288 lb (130.6 kg)  09/01/19 (!) 320 lb (145.2 kg)  05/25/19 (!)  322 lb (146.1 kg)    General: Appears their stated age, well developed, well nourished in NAD. Skin: Warm, dry and intact. No rashes, lesions or ulcerations noted. HEENT: Head: normal shape and size; Eyes: sclera white, no icterus, conjunctiva pink, PERRLA and EOMs intact; Ears: Tm's Navarro and intact, normal light reflex; Nose: mucosa pink and moist, septum midline; Throat/Mouth: Teeth present, mucosa pink and moist, no exudate, lesions or ulcerations noted.  Neck:  Neck supple, trachea midline. No masses, lumps or thyromegaly present.  Cardiovascular: Normal rate and rhythm. S1,S2 noted.  No murmur, rubs or gallops noted. No JVD or BLE edema. No carotid bruits noted. Pulmonary/Chest: Normal effort and positive vesicular breath sounds. No respiratory distress. No wheezes, rales or ronchi noted.  Abdomen: Soft and nontender. Normal bowel sounds. No distention or masses noted. Liver, spleen and kidneys non palpable. Musculoskeletal: Normal range of motion. No signs of joint swelling. No difficulty with gait.  Neurological: Alert and oriented. Cranial nerves II-XII grossly intact. Coordination normal.  Psychiatric: Mood and affect normal. Behavior is normal. Judgment and thought content normal.   EKG:  BMET    Component Value Date/Time   NA 137 06/27/2020 1547   K 4.4 06/27/2020 1547   CL 101 06/27/2020 1547   CO2 30 06/27/2020 1547   GLUCOSE 87 06/27/2020 1547   BUN 13 06/27/2020 1547   CREATININE 0.90 06/27/2020 1547   CALCIUM 10.1 06/27/2020 1547   GFRNONAA >90 10/12/2013 0114   GFRAA >90 10/12/2013 0114    Lipid Panel     Component Value Date/Time   CHOL 184 06/27/2020 1547   TRIG 164.0 (H) 06/27/2020 1547   HDL 41.30 06/27/2020 1547   CHOLHDL 4 06/27/2020 1547   VLDL 32.8 06/27/2020 1547   LDLCALC 110 (H) 06/27/2020 1547    CBC    Component Value Date/Time   WBC 11.9 (H) 06/27/2020 1547   RBC 6.17 (H) 06/27/2020 1547   HGB 17.0 06/27/2020 1547   HCT 50.4 06/27/2020  1547   PLT 170.0 06/27/2020 1547   MCV 81.7 06/27/2020 1547   MCH 27.9 08/25/2014 1828   MCHC 33.7 06/27/2020 1547   RDW 13.6 06/27/2020 1547   LYMPHSABS 2.4 08/25/2014 1828   MONOABS 0.5 08/25/2014 1828   EOSABS 0.1 08/25/2014 1828   BASOSABS 0.0 08/25/2014 1828    Hgb A1C Lab Results  Component Value Date   HGBA1C 5.8 06/27/2020           Assessment & Plan:    Nicki Reaper, NP This visit occurred during the SARS-CoV-2 public health emergency.  Safety protocols were in place, including screening questions prior to the visit, additional usage of staff PPE, and extensive cleaning of exam room while observing appropriate contact time as indicated for  disinfecting solutions.

## 2020-08-27 ENCOUNTER — Other Ambulatory Visit: Payer: Self-pay | Admitting: Internal Medicine

## 2020-09-02 ENCOUNTER — Other Ambulatory Visit: Payer: Self-pay | Admitting: Internal Medicine

## 2020-12-09 ENCOUNTER — Other Ambulatory Visit: Payer: Self-pay | Admitting: Internal Medicine

## 2020-12-12 NOTE — Telephone Encounter (Signed)
Last Fill or Written Date and Quantity: 09/04/20 #90 w/ 0 Last Office Visit and Type: 06/27/20 CPE Next Office Visit and Type: None scheduled but pt needs an appt for BP follow-up per last office note.

## 2021-03-11 ENCOUNTER — Other Ambulatory Visit: Payer: Self-pay | Admitting: Internal Medicine

## 2021-03-11 ENCOUNTER — Other Ambulatory Visit: Payer: Self-pay | Admitting: Family

## 2021-03-11 NOTE — Telephone Encounter (Signed)
Requested medication (s) are due for refill today: yes  Requested medication (s) are on the active medication list: No, dosage error   Last refill: 08/28/20 135 0 refills  Future visit scheduled yes 03/18/21  Notes to clinic: Called pt to secure appt, 03/18/21. Pt states he is taking cozaar 75mg , 1 and1/2 tabs of 50mg  tab. Please review. Thank you  Requested Prescriptions  Pending Prescriptions Disp Refills   losartan (COZAAR) 50 MG tablet [Pharmacy Med Name: LOSARTAN 50MG  TABLETS] 30 tablet     Sig: TAKE 1 TABLET(50 MG) BY MOUTH DAILY     There is no refill protocol information for this order

## 2021-03-18 ENCOUNTER — Ambulatory Visit: Payer: Managed Care, Other (non HMO) | Admitting: Internal Medicine

## 2021-03-18 ENCOUNTER — Other Ambulatory Visit: Payer: Self-pay

## 2021-03-18 ENCOUNTER — Encounter: Payer: Self-pay | Admitting: Internal Medicine

## 2021-03-18 VITALS — BP 137/65 | HR 92 | Temp 99.1°F | Wt 282.0 lb

## 2021-03-18 DIAGNOSIS — E781 Pure hyperglyceridemia: Secondary | ICD-10-CM | POA: Diagnosis not present

## 2021-03-18 DIAGNOSIS — I10 Essential (primary) hypertension: Secondary | ICD-10-CM | POA: Diagnosis not present

## 2021-03-18 DIAGNOSIS — F5101 Primary insomnia: Secondary | ICD-10-CM

## 2021-03-18 DIAGNOSIS — K219 Gastro-esophageal reflux disease without esophagitis: Secondary | ICD-10-CM | POA: Diagnosis not present

## 2021-03-18 DIAGNOSIS — E66812 Obesity, class 2: Secondary | ICD-10-CM | POA: Insufficient documentation

## 2021-03-18 DIAGNOSIS — R7303 Prediabetes: Secondary | ICD-10-CM

## 2021-03-18 DIAGNOSIS — Z6837 Body mass index (BMI) 37.0-37.9, adult: Secondary | ICD-10-CM

## 2021-03-18 DIAGNOSIS — L301 Dyshidrosis [pompholyx]: Secondary | ICD-10-CM

## 2021-03-18 DIAGNOSIS — E782 Mixed hyperlipidemia: Secondary | ICD-10-CM | POA: Insufficient documentation

## 2021-03-18 MED ORDER — LOSARTAN POTASSIUM 50 MG PO TABS: ORAL_TABLET | ORAL | 3 refills | Status: DC

## 2021-03-18 MED ORDER — AMLODIPINE BESYLATE 10 MG PO TABS: ORAL_TABLET | ORAL | 3 refills | Status: DC

## 2021-03-18 NOTE — Assessment & Plan Note (Signed)
Encouraged him to consume a low-fat diet C-Met and lipid profile today

## 2021-03-18 NOTE — Assessment & Plan Note (Signed)
Try to avoid foods that trigger reflux Encourage weight loss as this can help reduce reflux symptoms

## 2021-03-18 NOTE — Assessment & Plan Note (Signed)
Encourage diet and exercise for weight loss 

## 2021-03-18 NOTE — Progress Notes (Signed)
Subjective:    Patient ID: Alexander Navarro, male    DOB: April 21, 1986, 35 y.o.   MRN: [id number]  HPI  Patient presents the clinic today for follow-up of chronic conditions.  HTN: His BP today is 137/65.  He is taking Losartan and Amlodipine as prescribed.  ECG from 08/2014 reviewed.  GERD: Triggered by spicy and tomato-based foods.  He does not take any medications for this OTC.  Upper GI from 06/2014 reviewed.  Dyshidrotic Eczema: Managed with Mometasone ointment.  He does not follow with dermatology.  Insomnia: He has difficulty falling asleep and staying asleep but he reports this has improved.  He takes trazodone as needed with good results.  There is no sleep study on file.  HLD: His last LDL was 110, triglycerides 164, 06/2020.  He is not currently taking any cholesterol-lowering medication.  He does not consume a low-fat diet.  Prediabetes: His last A1c was 5.8%, 06/2020.  He is not taking any oral diabetic medication at this time.  He does not check his sugars.  Review of Systems     Past Medical History:  Diagnosis Date   Fatty liver    Frequent headaches    GERD (gastroesophageal reflux disease)    Obesity     Current Outpatient Medications  Medication Sig Dispense Refill   amLODipine (NORVASC) 10 MG tablet TAKE 1 TABLET(10 MG) BY MOUTH DAILY 90 tablet 0   losartan (COZAAR) 50 MG tablet TAKE 1 TABLET(50 MG) BY MOUTH DAILY 30 tablet 0   traZODone (DESYREL) 50 MG tablet TAKE 1/2 TO 1 TABLET(25 TO 50 MG) BY MOUTH AT BEDTIME AS NEEDED FOR SLEEP 30 tablet 0   mometasone (ELOCON) 0.1 % ointment Apply topically daily. (Patient not taking: Reported on 03/18/2021) 45 g 0   No current facility-administered medications for this visit.    Allergies  Allergen Reactions   Shrimp [Shellfish Allergy] Nausea And Vomiting    Family History  Problem Relation Age of Onset   Hypertension Mother    Thyroid disease Mother    Heart disease Father    Stomach cancer Maternal Grandmother     Prostate cancer Maternal Grandfather    Throat cancer Paternal Aunt    Colon cancer Neg Hx    Colon polyps Neg Hx    Kidney disease Neg Hx    Esophageal cancer Neg Hx    Gallbladder disease Neg Hx     Social History   Socioeconomic History   Marital status: Married    Spouse name: Not on file   Number of children: 0   Years of education: Not on file   Highest education level: Not on file  Occupational History   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  Tobacco Use   Smoking status: Every Day    Packs/day: 1.00    Years: 8.00    Pack years: 8.00    Types: Cigarettes    Last attempt to quit: 06/16/2012    Years since quitting: 8.7   Smokeless tobacco: Never  Substance and Sexual Activity   Alcohol use: Yes    Alcohol/week: 1.0 standard drink    Types: 1 Cans of beer per week    Comment: occasional   Drug use: No   Sexual activity: Yes  Other Topics Concern   Not on file  Social History Narrative   Not on file   Social Determinants of Health   Financial Resource Strain: Not on file  Food Insecurity: Not on file  Transportation  Needs: Not on file  Physical Activity: Not on file  Stress: Not on file  Social Connections: Not on file  Intimate Partner Violence: Not on file     Constitutional: Denies fever, malaise, fatigue, headache or abrupt weight changes.  HEENT: Denies eye pain, eye redness, ear pain, ringing in the ears, wax buildup, runny nose, nasal congestion, bloody nose, or sore throat. Respiratory: Denies difficulty breathing, shortness of breath, cough or sputum production.   Cardiovascular: Denies chest pain, chest tightness, palpitations or swelling in the hands or feet.  Gastrointestinal: Denies abdominal pain, bloating, constipation, diarrhea or blood in the stool.  GU: Denies urgency, frequency, pain with urination, burning sensation, blood in urine, odor or discharge. Musculoskeletal: Denies decrease in range of motion, difficulty with gait, muscle pain  or joint pain and swelling.  Skin: Pt reports dry skin. Denies redness, rashes, lesions or ulcercations.  Neurological: Pt reports insomnia. Denies dizziness, difficulty with memory, difficulty with speech or problems with balance and coordination.  Psych: Denies anxiety, depression, SI/HI.  No other specific complaints in a complete review of systems (except as listed in HPI above).  Objective:   Physical Exam   BP 137/65 (BP Location: Left Arm, Patient Position: Sitting, Cuff Size: Large)   Pulse 92   Temp 99.1 F (37.3 C) (Temporal)   Wt 282 lb (127.9 kg)   SpO2 99%   BMI 37.21 kg/m  Wt Readings from Last 3 Encounters:  03/18/21 282 lb (127.9 kg)  06/27/20 288 lb (130.6 kg)  09/01/19 (!) 320 lb (145.2 kg)    General: Appears his stated age, obese, in NAD. Skin: Warm, dry and intact.  HEENT: Head: normal shape and size; Eyes: sclera white and EOMs intact;  Cardiovascular: Normal rate and rhythm. S1,S2 noted.  No murmur, rubs or gallops noted. No JVD or BLE edema. No carotid bruits noted. Pulmonary/Chest: Normal effort and positive vesicular breath sounds. No respiratory distress. No wheezes, rales or ronchi noted.  Abdomen: Normal bowel sounds.  Musculoskeletal:  No difficulty with gait.  Neurological: Alert and oriented.  Psychiatric: Mood and affect normal. Behavior is normal. Judgment and thought content normal.   BMET    Component Value Date/Time   NA 137 06/27/2020 1547   K 4.4 06/27/2020 1547   CL 101 06/27/2020 1547   CO2 30 06/27/2020 1547   GLUCOSE 87 06/27/2020 1547   BUN 13 06/27/2020 1547   CREATININE 0.90 06/27/2020 1547   CALCIUM 10.1 06/27/2020 1547   GFRNONAA >90 10/12/2013 0114   GFRAA >90 10/12/2013 0114    Lipid Panel     Component Value Date/Time   CHOL 184 06/27/2020 1547   TRIG 164.0 (H) 06/27/2020 1547   HDL 41.30 06/27/2020 1547   CHOLHDL 4 06/27/2020 1547   VLDL 32.8 06/27/2020 1547   LDLCALC 110 (H) 06/27/2020 1547    CBC     Component Value Date/Time   WBC 11.9 (H) 06/27/2020 1547   RBC 6.17 (H) 06/27/2020 1547   HGB 17.0 06/27/2020 1547   HCT 50.4 06/27/2020 1547   PLT 170.0 06/27/2020 1547   MCV 81.7 06/27/2020 1547   MCH 27.9 08/25/2014 1828   MCHC 33.7 06/27/2020 1547   RDW 13.6 06/27/2020 1547   LYMPHSABS 2.4 08/25/2014 1828   MONOABS 0.5 08/25/2014 1828   EOSABS 0.1 08/25/2014 1828   BASOSABS 0.0 08/25/2014 1828    Hgb A1C Lab Results  Component Value Date   HGBA1C 5.8 06/27/2020  Assessment & Plan:    Nicki Reaper, NP  This visit occurred during the SARS-CoV-2 public health emergency.  Safety protocols were in place, including screening questions prior to the visit, additional usage of staff PPE, and extensive cleaning of exam room while observing appropriate contact time as indicated for disinfecting solutions.

## 2021-03-18 NOTE — Patient Instructions (Signed)
Heart-Healthy Eating Plan Heart-healthy meal planning includes: Eating less unhealthy fats. Eating more healthy fats. Making other changes in your diet. Talk with your doctor or a diet specialist (dietitian) to create an eating plan that is right for you. What is my plan? Your doctor may recommend an eating plan that includes: Total fat: ______% or less of total calories a day. Saturated fat: ______% or less of total calories a day. Cholesterol: less than _________mg a day. What are tips for following this plan? Cooking Avoid frying your food. Try to bake, boil, grill, or broil it instead. You can also reduce fat by: Removing the skin from poultry. Removing all visible fats from meats. Steaming vegetables in water or broth. Meal planning  At meals, divide your plate into four equal parts: Fill one-half of your plate with vegetables and green salads. Fill one-fourth of your plate with whole grains. Fill one-fourth of your plate with lean protein foods. Eat 4-5 servings of vegetables per day. A serving of vegetables is: 1 cup of raw or cooked vegetables. 2 cups of raw leafy greens. Eat 4-5 servings of fruit per day. A serving of fruit is: 1 medium whole fruit.  cup of dried fruit.  cup of fresh, frozen, or canned fruit.  cup of 100% fruit juice. Eat more foods that have soluble fiber. These are apples, broccoli, carrots, beans, peas, and barley. Try to get 20-30 g of fiber per day. Eat 4-5 servings of nuts, legumes, and seeds per week: 1 serving of dried beans or legumes equals  cup after being cooked. 1 serving of nuts is  cup. 1 serving of seeds equals 1 tablespoon. General information Eat more home-cooked food. Eat less restaurant, buffet, and fast food. Limit or avoid alcohol. Limit foods that are high in starch and sugar. Avoid fried foods. Lose weight if you are overweight. Keep track of how much salt (sodium) you eat. This is important if you have high blood  pressure. Ask your doctor to tell you more about this. Try to add vegetarian meals each week. Fats Choose healthy fats. These include olive oil and canola oil, flaxseeds, walnuts, almonds, and seeds. Eat more omega-3 fats. These include salmon, mackerel, sardines, tuna, flaxseed oil, and ground flaxseeds. Try to eat fish at least 2 times each week. Check food labels. Avoid foods with trans fats or high amounts of saturated fat. Limit saturated fats. These are often found in animal products, such as meats, butter, and cream. These are also found in plant foods, such as palm oil, palm kernel oil, and coconut oil. Avoid foods with partially hydrogenated oils in them. These have trans fats. Examples are stick margarine, some tub margarines, cookies, crackers, and other baked goods. What foods can I eat? Fruits All fresh, canned (in natural juice), or frozen fruits. Vegetables Fresh or frozen vegetables (raw, steamed, roasted, or grilled). Green salads. Grains Most grains. Choose whole wheat and whole grains most of the time. Rice and pasta, including brown rice and pastas made with whole wheat. Meats and other proteins Lean, well-trimmed beef, veal, pork, and lamb. Chicken and turkey without skin. All fish and shellfish. Wild duck, rabbit, pheasant, and venison. Egg whites or low-cholesterol egg substitutes. Dried beans, peas, lentils, and tofu. Seeds and most nuts. Dairy Low-fat or nonfat cheeses, including ricotta and mozzarella. Skim or 1% milk that is liquid, powdered, or evaporated. Buttermilk that is made with low-fat milk. Nonfat or low-fat yogurt. Fats and oils Non-hydrogenated (trans-free) margarines. Vegetable oils, including   soybean, sesame, sunflower, olive, peanut, safflower, corn, canola, and cottonseed. Salad dressings or mayonnaise made with a vegetable oil. Beverages Mineral water. Coffee and tea. Diet carbonated beverages. Sweets and desserts Sherbet, gelatin, and fruit ice.  Small amounts of dark chocolate. Limit all sweets and desserts. Seasonings and condiments All seasonings and condiments. The items listed above may not be a complete list of foods and drinks you can eat. Contact a dietitian for more options. What foods should I avoid? Fruits Canned fruit in heavy syrup. Fruit in cream or butter sauce. Fried fruit. Limit coconut. Vegetables Vegetables cooked in cheese, cream, or butter sauce. Fried vegetables. Grains Breads that are made with saturated or trans fats, oils, or whole milk. Croissants. Sweet rolls. Donuts. High-fat crackers, such as cheese crackers. Meats and other proteins Fatty meats, such as hot dogs, ribs, sausage, bacon, rib-eye roast or steak. High-fat deli meats, such as salami and bologna. Caviar. Domestic duck and goose. Organ meats, such as liver. Dairy Cream, sour cream, cream cheese, and creamed cottage cheese. Whole-milk cheeses. Whole or 2% milk that is liquid, evaporated, or condensed. Whole buttermilk. Cream sauce or high-fat cheese sauce. Yogurt that is made from whole milk. Fats and oils Meat fat, or shortening. Cocoa butter, hydrogenated oils, palm oil, coconut oil, palm kernel oil. Solid fats and shortenings, including bacon fat, salt pork, lard, and butter. Nondairy cream substitutes. Salad dressings with cheese or sour cream. Beverages Regular sodas and juice drinks with added sugar. Sweets and desserts Frosting. Pudding. Cookies. Cakes. Pies. Milk chocolate or white chocolate. Buttered syrups. Full-fat ice cream or ice cream drinks. The items listed above may not be a complete list of foods and drinks to avoid. Contact a dietitian for more information. Summary Heart-healthy meal planning includes eating less unhealthy fats, eating more healthy fats, and making other changes in your diet. Eat a balanced diet. This includes fruits and vegetables, low-fat or nonfat dairy, lean protein, nuts and legumes, whole grains, and  heart-healthy oils and fats. This information is not intended to replace advice given to you by your health care provider. Make sure you discuss any questions you have with your health care provider. Document Revised: 10/11/2020 Document Reviewed: 10/11/2020 Elsevier Patient Education  2022 [employer]  

## 2021-03-18 NOTE — Assessment & Plan Note (Signed)
Continue Mometasone as needed

## 2021-03-18 NOTE — Assessment & Plan Note (Signed)
Controlled on Losartan and Amlodipine, refilled today Reinforced DASH diet and exercise for weight loss C-Met

## 2021-03-18 NOTE — Assessment & Plan Note (Signed)
Encourage low-carb diet and exercise for weight loss A1c

## 2021-03-18 NOTE — Assessment & Plan Note (Signed)
Continue Trazodone as needed 

## 2021-03-19 LAB — LIPID PANEL
Cholesterol: 188 mg/dL (ref <200)
HDL: 35 mg/dL — ABNORMAL LOW (ref >=40)
LDL Cholesterol (Calc): 117 mg/dL (calc) — ABNORMAL HIGH
Non-HDL Cholesterol (Calc): 153 mg/dL (calc) — ABNORMAL HIGH (ref <130)
Total CHOL/HDL Ratio: 5.4 (calc) — ABNORMAL HIGH (ref <5.0)
Triglycerides: 251 mg/dL — ABNORMAL HIGH (ref <150)

## 2021-03-19 LAB — COMPLETE METABOLIC PANEL WITH GFR
AG Ratio: 2.3 (calc) (ref 1.0–2.5)
ALT: 24 U/L (ref 9–46)
AST: 27 U/L (ref 10–40)
Albumin: 4.9 g/dL (ref 3.6–5.1)
Alkaline phosphatase (APISO): 51 U/L (ref 36–130)
BUN: 14 mg/dL (ref 7–25)
CO2: 30 mmol/L (ref 20–32)
Calcium: 10.1 mg/dL (ref 8.6–10.3)
Chloride: 102 mmol/L (ref 98–110)
Creat: 0.8 mg/dL (ref 0.60–1.26)
Globulin: 2.1 g/dL (calc) (ref 1.9–3.7)
Glucose, Bld: 89 mg/dL (ref 65–139)
Potassium: 4.8 mmol/L (ref 3.5–5.3)
Sodium: 139 mmol/L (ref 135–146)
Total Bilirubin: 0.6 mg/dL (ref 0.2–1.2)
Total Protein: 7 g/dL (ref 6.1–8.1)
eGFR: 120 mL/min/{1.73_m2} (ref >=60)

## 2021-03-19 LAB — HEMOGLOBIN A1C
Hgb A1c MFr Bld: 5.2 % of total Hgb (ref <5.7)
Mean Plasma Glucose: 103 mg/dL
eAG (mmol/L): 5.7 mmol/L

## 2021-10-24 ENCOUNTER — Ambulatory Visit: Payer: Self-pay

## 2021-10-24 ENCOUNTER — Other Ambulatory Visit: Payer: Self-pay

## 2021-10-24 ENCOUNTER — Emergency Department: Payer: Managed Care, Other (non HMO)

## 2021-10-24 ENCOUNTER — Emergency Department
Admission: EM | Admit: 2021-10-24 | Discharge: 2021-10-24 | Disposition: A | Discharge status: Home / Self Care | Payer: Managed Care, Other (non HMO) | Attending: Emergency Medicine | Admitting: Emergency Medicine

## 2021-10-24 DIAGNOSIS — K429 Umbilical hernia without obstruction or gangrene: Secondary | ICD-10-CM | POA: Diagnosis not present

## 2021-10-24 DIAGNOSIS — I10 Essential (primary) hypertension: Secondary | ICD-10-CM | POA: Diagnosis not present

## 2021-10-24 DIAGNOSIS — R1033 Periumbilical pain: Secondary | ICD-10-CM | POA: Diagnosis present

## 2021-10-24 LAB — COMPREHENSIVE METABOLIC PANEL
ALT: 24 U/L (ref 0–44)
AST: 21 U/L (ref 15–41)
Albumin: 4.5 g/dL (ref 3.5–5.0)
Alkaline Phosphatase: 60 U/L (ref 38–126)
Anion gap: 4 — ABNORMAL LOW (ref 5–15)
BUN: 14 mg/dL (ref 6–20)
CO2: 25 mmol/L (ref 22–32)
Calcium: 9.2 mg/dL (ref 8.9–10.3)
Chloride: 103 mmol/L (ref 98–111)
Creatinine, Ser: 0.74 mg/dL (ref 0.61–1.24)
GFR, Estimated: >60 mL/min (ref >60)
Glucose, Bld: 98 mg/dL (ref 70–99)
Potassium: 3.7 mmol/L (ref 3.5–5.1)
Sodium: 132 mmol/L — ABNORMAL LOW (ref 135–145)
Total Bilirubin: 0.7 mg/dL (ref 0.3–1.2)
Total Protein: 7 g/dL (ref 6.5–8.1)

## 2021-10-24 LAB — URINALYSIS, ROUTINE W REFLEX MICROSCOPIC
Bilirubin Urine: NEGATIVE
Glucose, UA: NEGATIVE mg/dL
Hgb urine dipstick: NEGATIVE
Ketones, ur: NEGATIVE mg/dL
Leukocytes,Ua: NEGATIVE
Nitrite: NEGATIVE
Protein, ur: NEGATIVE mg/dL
Specific Gravity, Urine: 1.012 (ref 1.005–1.030)
pH: 7 (ref 5.0–8.0)

## 2021-10-24 LAB — LIPASE, BLOOD: Lipase: 29 U/L (ref 11–51)

## 2021-10-24 LAB — CBC
HCT: 45.3 % (ref 39.0–52.0)
Hemoglobin: 15.7 g/dL (ref 13.0–17.0)
MCH: 27.5 pg (ref 26.0–34.0)
MCHC: 34.7 g/dL (ref 30.0–36.0)
MCV: 79.5 fL — ABNORMAL LOW (ref 80.0–100.0)
Platelets: 182 10*3/uL (ref 150–400)
RBC: 5.7 MIL/uL (ref 4.22–5.81)
RDW: 12.9 % (ref 11.5–15.5)
WBC: 13.5 10*3/uL — ABNORMAL HIGH (ref 4.0–10.5)
nRBC: 0 % (ref 0.0–0.2)

## 2021-10-24 MED ORDER — OXYCODONE-ACETAMINOPHEN 5-325 MG PO TABS: 1.0000 | ORAL_TABLET | ORAL | 0 refills | Status: DC | PRN

## 2021-10-24 MED ORDER — LACTATED RINGERS IV BOLUS
1000.0000 mL | Freq: Once | INTRAVENOUS | Status: AC
  Administered 2021-10-24: 1000 mL via INTRAVENOUS

## 2021-10-24 MED ORDER — ONDANSETRON 4 MG PO TBDP: 4.0000 mg | ORAL_TABLET | Freq: Three times a day (TID) | ORAL | 0 refills | Status: DC | PRN

## 2021-10-24 MED ORDER — IOHEXOL 300 MG/ML  SOLN
100.0000 mL | Freq: Once | INTRAMUSCULAR | Status: AC | PRN
  Administered 2021-10-24: 100 mL via INTRAVENOUS
  Filled 2021-10-24: qty 100

## 2021-10-24 MED ORDER — MORPHINE SULFATE (PF) 4 MG/ML IV SOLN
4.0000 mg | Freq: Once | INTRAVENOUS | Status: AC
  Administered 2021-10-24: 4 mg via INTRAVENOUS
  Filled 2021-10-24: qty 1

## 2021-10-24 MED ORDER — ONDANSETRON HCL 4 MG/2ML IJ SOLN
4.0000 mg | Freq: Once | INTRAMUSCULAR | Status: AC
  Administered 2021-10-24: 4 mg via INTRAVENOUS
  Filled 2021-10-24: qty 2

## 2021-10-24 NOTE — ED Triage Notes (Signed)
Patient to ER via [facility]. Reports having a hernia x1 year, this morning reports waking up with worsening pain. Describes pain as aching, epigastric, non-radiating. Reports some nausea. Denies vomiting/ diarrhea. Denies injury.  ?

## 2021-10-24 NOTE — Telephone Encounter (Signed)
FYI

## 2021-10-24 NOTE — Telephone Encounter (Signed)
?  Chief Complaint: abdominal pain ?Symptoms: umbilical hernia, pain 8/10, constant, bulging out, tenderness ?Frequency: been there for 1-2 years but gotten worse  ?Pertinent Negatives: NA ?Disposition: [x] ED /[] Urgent Care (no appt availability in office) / [] Appointment(In office/virtual)/ []  [facility]/ [] Home Care/ [] Refused Recommended Disposition /[] [facility]/ []  Follow-up with PCP ?Additional Notes: pt states that hurts to touch anywhere on the abdomen and unable to keep hernia in. No appts today so advised pt to go to ED to be evaluated.   Pt states he would go this evening. ? ?Reason for Disposition ? SEVERE abdominal pain ? ?Answer Assessment - Initial Assessment Questions ?1. ONSET:  "When did this first appear?" ?    1-2 years  ?3. SIZE: "How big is it?" (inches, cm or compare to coins, fruit) ?    Size of tangerine  ?4. LOCATION: "Where exactly is the hernia located?" ?    Umbilical  ?5. PATTERN: "Does the swelling come and go, or has it been constant since it started?" ?    Constant  ?6. PAIN: "Is there any pain?" If Yes, ask: "How bad is it?"  (Scale 1-10; or mild, moderate, severe) ?    8 ?8. OTHER SYMPTOMS: "Do you have any other symptoms?" (e.g., fever, abdominal pain, vomiting) ?    Abdominal pain ? ?Protocols used: Hernia-A-AH ? ?

## 2021-10-24 NOTE — ED Provider Notes (Signed)
? ?[facility] ?Provider Note ? ? ? Event Date/Time  ? First MD Initiated Contact with Patient 10/24/21 1634   ?  (approximate) ? ? ?History  ? ?Chief Complaint ?Abdominal Pain ? ? ?HPI ? ?Alexander Navarro is a 36 y.o. male with past medical history of hypertension, hyperlipidemia, and GERD who presents to the ED complaining of abdominal pain.  Patient reports that over the course the day he has been dealing with increasing pain centered around his umbilicus and moving upwards in his abdomen towards his chest.  Pain is described as sharp and constant, worse with movement and certain positions.  Patient reports feeling nauseous with dry heaving, but has not vomited.  His last bowel movement was yesterday evening and he states it was normal then.  He is primarily concerned about his umbilical hernia, which she states has been very tender today and he has had a hard time reducing it at home.  He denies any fevers, cough, dysuria, flank pain, chest pain, or shortness of breath. ?  ? ? ?Physical Exam  ? ?Triage Vital Signs: ?ED Triage Vitals  ?Enc Vitals Group  ?   BP 10/24/21 1547 126/79  ?   Pulse Rate 10/24/21 1547 96  ?   Resp 10/24/21 1547 18  ?   Temp 10/24/21 1547 98.5 ?F (36.9 ?C)  ?   Temp Source 10/24/21 1547 Oral  ?   SpO2 10/24/21 1547 96 %  ?   Weight 10/24/21 1546 255 lb (115.7 kg)  ?   Height 10/24/21 1546 6\' 2"  (1.88 m)  ?   Head Circumference --   ?   Peak Flow --   ?   Pain Score 10/24/21 1545 7  ?   Pain Loc --   ?   Pain Edu? --   ?   Excl. in GC? --   ? ? ?Most recent vital signs: ?Vitals:  ? 10/24/21 1547 10/24/21 1736  ?BP: 126/79 110/69  ?Pulse: 96 74  ?Resp: 18 18  ?Temp: 98.5 ?F (36.9 ?C)   ?SpO2: 96% 97%  ? ? ?Constitutional: Alert and oriented. ?Eyes: Conjunctivae are normal. ?Head: Atraumatic. ?Nose: No congestion/rhinnorhea. ?Mouth/Throat: Mucous membranes are moist. ?Cardiovascular: Normal rate, regular rhythm. Grossly normal heart sounds.  2+ radial pulses  bilaterally. ?Respiratory: Normal respiratory effort.  No retractions. Lungs CTAB. ?Gastrointestinal: Soft and tender to palpation diffusely, greatest at the umbilicus where he has a small hernia that is reducible. No distention. ?Musculoskeletal: No lower extremity tenderness nor edema.  ?Neurologic:  Normal speech and language. No gross focal neurologic deficits are appreciated. ? ? ? ?ED Results / Procedures / Treatments  ? ?Labs ?(all labs ordered are listed, but only abnormal results are displayed) ?Labs Reviewed  ?COMPREHENSIVE METABOLIC PANEL - Abnormal; Notable for the following components:  ?    Result Value  ? Sodium 132 (*)   ? Anion gap 4 (*)   ? All other components within normal limits  ?CBC - Abnormal; Notable for the following components:  ? WBC 13.5 (*)   ? MCV 79.5 (*)   ? All other components within normal limits  ?URINALYSIS, ROUTINE W REFLEX MICROSCOPIC - Abnormal; Notable for the following components:  ? Color, Urine STRAW (*)   ? APPearance CLEAR (*)   ? All other components within normal limits  ?LIPASE, BLOOD  ? ? ? ?EKG ? ?ED ECG REPORT ?12/24/21, the attending physician, personally viewed and interpreted this ECG. ? ?  Date: 10/24/2021 ? EKG Time: 15:50 ? Rate: 87 ? Rhythm: normal sinus rhythm ? Axis: Normal ? Intervals:none ? ST&T Change: None ? ?RADIOLOGY ?CT of abdomen/pelvis reviewed and interpreted by me with fat-containing umbilical hernia, no dilated bowel loops noted to suggest obstruction. ? ?PROCEDURES: ? ?Critical Care performed: No ? ?Procedures ? ? ?MEDICATIONS ORDERED IN ED: ?Medications  ?morphine (PF) 4 MG/ML injection 4 mg (4 mg Intravenous Given 10/24/21 1727)  ?ondansetron (ZOFRAN) injection 4 mg (4 mg Intravenous Given 10/24/21 1726)  ?lactated ringers bolus 1,000 mL (1,000 mLs Intravenous New Bag/Given 10/24/21 1727)  ?iohexol (OMNIPAQUE) 300 MG/ML solution 100 mL (100 mLs Intravenous Contrast Given 10/24/21 1801)  ? ? ? ?IMPRESSION / MDM / ASSESSMENT AND PLAN / ED  COURSE  ?I reviewed the triage vital signs and the nursing notes. ?             ?               ? ?36 y.o. male with past medical history of hypertension, hyperlipidemia, and GERD who presents to the ED for worsening pain centered around his umbilicus since this morning associated with nausea and dry heaving. ? ?Differential diagnosis includes, but is not limited to, incarcerated hernia, strangulated hernia, fat-containing hernia, bowel obstruction. ? ?Patient well-appearing and in no acute distress, vital signs are unremarkable.  He has diffuse tenderness of his abdomen on exam but greatest at the umbilicus, where he has a small hernia.  I was able to reduce the hernia with constant pressure, although the hernia itself is quite tender to palpation.  We will further assess with CT scan to ensure hernia is fully reduced with no signs of incarceration or strangulation.  Labs thus far show mild leukocytosis with no anemia, CMP and lipase are pending.  Plan to treat symptomatically with IV morphine and Zofran, hydrate with IV fluids and reassess following CT imaging. ? ?CT of abdomen/pelvis shows fat-containing umbilical hernia with mild inflammatory changes but no strangulated or dilated bowel loops noted.  On reassessment, patient reports that pain is improved and hernia remains reducible.  He is appropriate for discharge home with outpatient general surgery follow-up, was counseled to return to the ED for new worsening symptoms.  He will be prescribed short course of pain and nausea medicine, patient agrees with plan. ? ?  ? ? ?FINAL CLINICAL IMPRESSION(S) / ED DIAGNOSES  ? ?Final diagnoses:  ?Umbilical hernia without obstruction and without gangrene  ? ? ? ?Rx / DC Orders  ? ?ED Discharge Orders   ? ?      Ordered  ?  oxyCODONE-acetaminophen (PERCOCET) 5-325 MG tablet  Every 4 hours PRN       ? 10/24/21 1930  ?  ondansetron (ZOFRAN-ODT) 4 MG disintegrating tablet  Every 8 hours PRN       ? 10/24/21 1930  ? ?  ?  ? ?   ? ? ? ?Note:  This document was prepared using Dragon voice recognition software and may include unintentional dictation errors. ?  ?Chesley Noon, MD ?10/24/21 1933 ? ?

## 2021-10-24 NOTE — ED Notes (Signed)
This RN first encounter with pt prior to discharge. Pt verbalized understanding of discharge instructions, prescriptions, and follow-up care instructions. Pt advised if symptoms worsen to return to ED.  

## 2021-11-08 ENCOUNTER — Ambulatory Visit: Payer: Managed Care, Other (non HMO) | Admitting: Surgery

## 2021-11-29 ENCOUNTER — Encounter: Payer: Self-pay | Admitting: Surgery

## 2021-11-29 ENCOUNTER — Ambulatory Visit: Payer: Managed Care, Other (non HMO) | Admitting: Surgery

## 2021-11-29 VITALS — BP 119/77 | HR 84 | Temp 98.4°F | Ht 74.0 in | Wt 257.2 lb

## 2021-11-29 DIAGNOSIS — K420 Umbilical hernia with obstruction, without gangrene: Secondary | ICD-10-CM | POA: Diagnosis not present

## 2021-11-29 NOTE — Progress Notes (Unsigned)
11/29/2021  Reason for Visit:  Incarcerated umbilical hernia  History of Present Illness: PELLEGRINO Navarro is a 36 y.o. male presenting for evaluation of an umbilical hernia.  The patient has had this hernia for a long time.  He reports that initially the hernia was not too symptomatic and would only bother him mildly from time to time.  More recently it has become more symptomatic and more often as well.  He presented to the ED on 10/24/21 with worsening pain at the hernia site after having a coughing fit which cause the hernia to protrude more.  He went to work after and kept having pain at work while doing heavy lifting.  In the ED, the hernia was able to be partially reduced though the patient was quite tender to palpation.  He had labwork showing a WBC of 13.5, and otherwise unremarkable.  He had a CT scan of abdomen/pelvis which showed an umbilical containing fat with some associated mild inflammation.  The hernia defect was about 2.5 cm per my measurements.  After the hernia was partially reduced, the patient felt better and was discharged home.  Today, the patient denies any significant pain but he realizes that things are overall getting worse and he is considering surgical repair.  Past Medical History: Past Medical History:  Diagnosis Date   Fatty liver    Frequent headaches    GERD (gastroesophageal reflux disease)    Obesity      Past Surgical History: Past Surgical History:  Procedure Laterality Date   WISDOM TOOTH EXTRACTION      Home Medications: Prior to Admission medications   Medication Sig Start Date End Date Taking? Authorizing Provider  amLODipine (NORVASC) 10 MG tablet TAKE 1 TABLET(10 MG) BY MOUTH DAILY 03/18/21  Yes Baity, Salvadore [place], NP  losartan (COZAAR) 50 MG tablet TAKE 1 TABLET(50 MG) BY MOUTH DAILY 03/18/21  Yes Lorre Munroe, NP  traZODone (DESYREL) 50 MG tablet TAKE 1/2 TO 1 TABLET(25 TO 50 MG) BY MOUTH AT BEDTIME AS NEEDED FOR SLEEP 09/04/20  Yes Lorre Munroe, NP    Allergies: Allergies  Allergen Reactions   Shrimp [Shellfish Allergy] Nausea And Vomiting    Social History:  reports that he has been smoking cigarettes. He has a 8.00 pack-year smoking history. He has never used smokeless tobacco. He reports current alcohol use of about 1.0 standard drink of alcohol per week. He reports that he does not use drugs.   Family History: Family History  Problem Relation Age of Onset   Hypertension Mother    Thyroid disease Mother    Heart disease Father    Stomach cancer Maternal Grandmother    Prostate cancer Maternal Grandfather    Throat cancer Paternal Aunt    Colon cancer Neg Hx    Colon polyps Neg Hx    Kidney disease Neg Hx    Esophageal cancer Neg Hx    Gallbladder disease Neg Hx     Review of Systems: Review of Systems  Constitutional:  Negative for chills and fever.  HENT:  Negative for hearing loss.   Respiratory:  Negative for shortness of breath.   Cardiovascular:  Negative for chest pain.  Gastrointestinal:  Positive for abdominal pain. Negative for nausea and vomiting.  Genitourinary:  Negative for dysuria.  Musculoskeletal:  Negative for myalgias.  Skin:  Negative for rash.  Neurological:  Negative for dizziness.  Psychiatric/Behavioral:  Negative for depression.     Physical Exam BP 119/77  Pulse 84   Temp 98.4 F (36.9 C) (Oral)   Ht 6\' 2"  (1.88 m)   Wt 257 lb 3.2 oz (116.7 kg)   SpO2 98%   BMI 33.02 kg/m  CONSTITUTIONAL: No acute distress, well nourished. HEENT:  Normocephalic, atraumatic, extraocular motion intact. NECK: Trachea is midline, and there is no jugular venous distension.  RESPIRATORY:  Lungs are clear, and breath sounds are equal bilaterally. Normal respiratory effort without pathologic use of accessory muscles. CARDIOVASCULAR: Heart is regular without murmurs, gallops, or rubs. GI: The abdomen is soft, non-distended, with some tenderness to deep palpation at the umbilical hernia site.   The hernia itself is only partially reducible, partially incarcerated, with some tenderness when trying to reduce.  No overlying skin changes.  The hernia defect appears small, as seen about 2.5 cm on CT scan, but I'm unable to fully palpate the edges due to being partially incarcerated.  MUSCULOSKELETAL:  Normal muscle strength and tone in all four extremities.  No peripheral edema or cyanosis. SKIN: Skin turgor is normal. There are no pathologic skin lesions.  NEUROLOGIC:  Motor and sensation is grossly normal.  Cranial nerves are grossly intact. PSYCH:  Alert and oriented to person, place and time. Affect is normal.  Laboratory Analysis: Labs from 10/24/21: Na 132, K 3.7, Cl 103, CO2 25, BUN 14, Cr 0.74.  LFTs within normal.  WBC 13.5, Hgb 15.7, Hct 45.3, Plt 182.  Imaging: CT abdomen/pelvis on 10/24/21: IMPRESSION: Mild inflammation associated with a moderate-sized fat containing paraumbilical hernia, consider clinical correlation for reducibility.  Assessment and Plan: This is a 36 y.o. male with an incarcerated umbilical hernia.  --Discussed with the patient how hernias may form and how over the course of time they can progress to become bigger or have more symptoms.  Discussed with him that unfortunately there's no medical way to fix the hernia, and surgery is the recommendation given his symptoms.  His symptoms currently are milder and there's some flexibility with regards to timing.  He's in agreement with surgical plans. --Discussed with him the planned surgery would be a robotic assisted incarcerated umbilical hernia repair.  Discussed the difference with open surgery, and reviewed the surgery at length including the incisions, the risks of bleeding, infection, injury to surrounding structures, the use of mesh, that this is an outpatient surgery, post-op activity restrictions, pain control, and he's willing to proceed. --He will need to arrange with work about timing for surgery, given  the post-op activity restrictions.  He will discuss with them and get back to 20 to schedule surgery.  Discussed that we can certainly help with any paperwork such as FMLA or disability for surgery.  Also discussed that if surgery is scheduled more than 30 days out, he would need another visit for H&P update.  He understands this and all of his questions have been answered.  I spent 60 minutes dedicated to the care of this patient on the date of this encounter to include pre-visit review of records, face-to-face time with the patient discussing diagnosis and management, and any post-visit coordination of care.   [place], MD [facility]

## 2021-11-29 NOTE — Patient Instructions (Addendum)
If you have any concerns or questions, please feel free to call our office.   Umbilical Hernia, Adult  A hernia is a bulge of tissue that pushes through an opening between muscles. An umbilical hernia happens in the abdomen, near the belly button (umbilicus). The hernia may contain tissues from the small intestine, large intestine, or fatty tissue covering the intestines. Umbilical hernias in adults tend to get worse over time, and they require surgical treatment. There are different types of umbilical hernias, including: Indirect hernia. This type is located just above or below the umbilicus. It is the most common type of umbilical hernia in adults. Direct hernia. This type forms through an opening formed by the umbilicus. Reducible hernia. This type of hernia comes and goes. It may be visible only when you strain, lift something heavy, or cough. This type of hernia can be pushed back into the abdomen (reduced). Incarcerated hernia. This type traps abdominal tissue inside the hernia. This type of hernia cannot be reduced. Strangulated hernia. This type of hernia cuts off blood flow to the tissues inside the hernia. The tissues can start to die if this happens. This type of hernia requires emergency treatment. What are the causes? An umbilical hernia happens when tissue inside the abdomen presses on a weak area of the abdominal muscles. What increases the risk? You may have a greater risk of this condition if you: Are obese. Have had several pregnancies. Have a buildup of fluid inside your abdomen. Have had surgery that weakens the abdominal muscles. What are the signs or symptoms? The main symptom of this condition is a painless bulge at or near the belly button. A reducible hernia may be visible only when you strain, lift something heavy, or cough. Other symptoms may include: Dull pain. A feeling of pressure. Symptoms of a strangulated hernia may include: Pain that gets increasingly  worse. Nausea and vomiting. Pain when pressing on the hernia. Skin over the hernia becoming red or purple. Constipation. Blood in the stool. How is this diagnosed? This condition may be diagnosed based on: A physical exam. You may be asked to cough or strain while standing. These actions increase the pressure inside your abdomen and can force the hernia through the opening in your muscles. Your health care provider may try to reduce the hernia by pressing on it. Your symptoms and medical history. How is this treated? Surgery is the only treatment for an umbilical hernia. Surgery for a strangulated hernia is done as soon as possible. If you have a small hernia that is not incarcerated, you may need to lose weight before having surgery. Follow these instructions at home: Lose weight, if told by your health care provider. Do not try to push the hernia back in. Watch your hernia for any changes in color or size. Tell your health care provider if any changes occur. You may need to avoid activities that increase pressure on your hernia. Do not lift anything that is heavier than 10 lb (4.5 kg), or the limit that you are told, until your health care provider says that it is safe. Take over-the-counter and prescription medicines only as told by your health care provider. Keep all follow-up visits. This is important. Contact a health care provider if: Your hernia gets larger. Your hernia becomes painful. Get help right away if: You develop sudden, severe pain near the area of your hernia. You have pain as well as nausea or vomiting. You have pain and the skin over your   hernia changes color. You develop a fever or chills. Summary A hernia is a bulge of tissue that pushes through an opening between muscles. An umbilical hernia happens near the belly button. Surgery is the only treatment for an umbilical hernia. Do not try to push your hernia back in. Keep all follow-up visits. This is  important. This information is not intended to replace advice given to you by your health care provider. Make sure you discuss any questions you have with your health care provider. Document Revised: 01/09/2020 Document Reviewed: 01/09/2020 Elsevier Patient Education  2023 Elsevier Inc.  

## 2021-12-02 ENCOUNTER — Telehealth: Payer: Self-pay | Admitting: Surgery

## 2021-12-02 NOTE — Telephone Encounter (Signed)
Outgoing call to patient to check on his status for scheduling surgery.  At this time, he states his boss is on vacation and will have to wait until he returns so that he can coordinate a good time for him to have the surgery. He will call us back when ready.

## 2021-12-18 ENCOUNTER — Telehealth: Payer: Self-pay | Admitting: Surgery

## 2021-12-18 NOTE — Telephone Encounter (Signed)
Outgoing call is made again, left message for patient to call to see if he is now ready for scheduling of his surgery.

## 2021-12-24 ENCOUNTER — Telehealth: Payer: Self-pay | Admitting: Surgery

## 2021-12-24 NOTE — Telephone Encounter (Signed)
Another message is left today for patient to call the office.  Since 11/29/21 have been trying to get this patient's surgery scheduled, he has yet to call the office back.  If patient calls back and is after 12/29/21, he will need follow up in office again with Dr. Aleen Campi.

## 2022-01-17 ENCOUNTER — Other Ambulatory Visit: Payer: Self-pay | Admitting: Internal Medicine

## 2022-01-17 NOTE — Telephone Encounter (Signed)
rx was sent to pharmacy on 03/18/21 #90/3. Pt should have 1 RF remaining.  Requested Prescriptions  Pending Prescriptions Disp Refills  . losartan (COZAAR) 50 MG tablet [Pharmacy Med Name: LOSARTAN 50MG  TABLETS] 90 tablet 3    Sig: TAKE 1 TABLET(50 MG) BY MOUTH DAILY     Cardiovascular:  Angiotensin Receptor Blockers Failed - 01/17/2022  5:19 PM      Failed - Valid encounter within last 6 months    Recent Outpatient Visits          10 months ago Pure hypertriglyceridemia   [facility] [place], Mullins W, NP             Passed - Cr in normal range and within 180 days    Creat  Date Value Ref Range Status  03/18/2021 0.80 0.60 - 1.26 mg/dL Final   Creatinine, Ser  Date Value Ref Range Status  10/24/2021 0.74 0.61 - 1.24 mg/dL Final         Passed - K in normal range and within 180 days    Potassium  Date Value Ref Range Status  10/24/2021 3.7 3.5 - 5.1 mmol/L Final         Passed - Patient is not pregnant      Passed - Last BP in normal range    BP Readings from Last 1 Encounters:  11/29/21 119/77

## 2022-05-31 ENCOUNTER — Other Ambulatory Visit: Payer: Self-pay | Admitting: Internal Medicine

## 2022-06-02 NOTE — Telephone Encounter (Signed)
Requested medication (s) are due for refill today: yes  Requested medication (s) are on the active medication list: yes  Last refill:  03/18/21 #90 3 RF  Future visit scheduled: no  Notes to clinic:  called pt and LM on VM to call back to make appt.    Requested Prescriptions  Pending Prescriptions Disp Refills   amLODipine (NORVASC) 10 MG tablet [Pharmacy Med Name: AMLODIPINE BESYLATE 10MG  TABLETS] 90 tablet 3    Sig: TAKE 1 TABLET(10 MG) BY MOUTH DAILY     Cardiovascular: Calcium Channel Blockers 2 Failed - 05/31/2022  9:55 AM      Failed - Valid encounter within last 6 months    Recent Outpatient Visits           1 year ago Pure hypertriglyceridemia   [facility] [place], Mullins W, NP              Passed - Last BP in normal range    BP Readings from Last 1 Encounters:  11/29/21 119/77         Passed - Last Heart Rate in normal range    Pulse Readings from Last 1 Encounters:  11/29/21 84

## 2023-01-13 ENCOUNTER — Other Ambulatory Visit: Payer: Self-pay

## 2023-01-13 ENCOUNTER — Encounter: Payer: Self-pay | Admitting: Emergency Medicine

## 2023-01-13 ENCOUNTER — Emergency Department
Admission: EM | Admit: 2023-01-13 | Discharge: 2023-01-13 | Disposition: A | Discharge status: Home / Self Care | Payer: Managed Care, Other (non HMO) | Attending: Emergency Medicine | Admitting: Emergency Medicine

## 2023-01-13 ENCOUNTER — Emergency Department: Payer: Managed Care, Other (non HMO)

## 2023-01-13 DIAGNOSIS — J168 Pneumonia due to other specified infectious organisms: Secondary | ICD-10-CM | POA: Diagnosis not present

## 2023-01-13 DIAGNOSIS — I10 Essential (primary) hypertension: Secondary | ICD-10-CM | POA: Insufficient documentation

## 2023-01-13 DIAGNOSIS — J189 Pneumonia, unspecified organism: Secondary | ICD-10-CM

## 2023-01-13 DIAGNOSIS — R509 Fever, unspecified: Secondary | ICD-10-CM | POA: Diagnosis present

## 2023-01-13 DIAGNOSIS — Z20822 Contact with and (suspected) exposure to covid-19: Secondary | ICD-10-CM | POA: Diagnosis not present

## 2023-01-13 LAB — BASIC METABOLIC PANEL
Anion gap: 11 (ref 5–15)
BUN: 17 mg/dL (ref 6–20)
CO2: 23 mmol/L (ref 22–32)
Calcium: 9.2 mg/dL (ref 8.9–10.3)
Chloride: 98 mmol/L (ref 98–111)
Creatinine, Ser: 1.07 mg/dL (ref 0.61–1.24)
GFR, Estimated: >60 mL/min (ref >60)
Glucose, Bld: 119 mg/dL — ABNORMAL HIGH (ref 70–99)
Potassium: 3.9 mmol/L (ref 3.5–5.1)
Sodium: 132 mmol/L — ABNORMAL LOW (ref 135–145)

## 2023-01-13 LAB — CBC WITH DIFFERENTIAL/PLATELET
Abs Immature Granulocytes: 0.13 10*3/uL — ABNORMAL HIGH (ref 0.00–0.07)
Basophils Absolute: 0.1 10*3/uL (ref 0.0–0.1)
Basophils Relative: 0 %
Eosinophils Absolute: 0 10*3/uL (ref 0.0–0.5)
Eosinophils Relative: 0 %
HCT: 48 % (ref 39.0–52.0)
Hemoglobin: 16.6 g/dL (ref 13.0–17.0)
Immature Granulocytes: 1 %
Lymphocytes Relative: 4 %
Lymphs Abs: 0.7 10*3/uL (ref 0.7–4.0)
MCH: 28 pg (ref 26.0–34.0)
MCHC: 34.6 g/dL (ref 30.0–36.0)
MCV: 80.9 fL (ref 80.0–100.0)
Monocytes Absolute: 1.3 10*3/uL — ABNORMAL HIGH (ref 0.1–1.0)
Monocytes Relative: 7 %
Neutro Abs: 17.2 10*3/uL — ABNORMAL HIGH (ref 1.7–7.7)
Neutrophils Relative %: 88 %
Platelets: 147 10*3/uL — ABNORMAL LOW (ref 150–400)
RBC: 5.93 MIL/uL — ABNORMAL HIGH (ref 4.22–5.81)
RDW: 12.6 % (ref 11.5–15.5)
WBC: 19.4 10*3/uL — ABNORMAL HIGH (ref 4.0–10.5)
nRBC: 0 % (ref 0.0–0.2)

## 2023-01-13 LAB — SARS CORONAVIRUS 2 BY RT PCR: SARS Coronavirus 2 by RT PCR: NEGATIVE

## 2023-01-13 MED ORDER — AZITHROMYCIN 250 MG PO TABS: ORAL_TABLET | ORAL | 0 refills | Status: DC

## 2023-01-13 MED ORDER — ACETAMINOPHEN 325 MG PO TABS
650.0000 mg | ORAL_TABLET | Freq: Once | ORAL | Status: AC
Start: 2023-01-13 — End: 2023-01-13
  Administered 2023-01-13: 650 mg via ORAL
  Filled 2023-01-13: qty 2

## 2023-01-13 MED ORDER — IBUPROFEN 600 MG PO TABS
600.0000 mg | ORAL_TABLET | Freq: Once | ORAL | Status: AC
  Administered 2023-01-13: 600 mg via ORAL
  Filled 2023-01-13: qty 1

## 2023-01-13 MED ORDER — CEFPODOXIME PROXETIL 200 MG PO TABS: 200.0000 mg | ORAL_TABLET | Freq: Two times a day (BID) | ORAL | 0 refills | Status: AC

## 2023-01-13 MED ORDER — ONDANSETRON 4 MG PO TBDP
4.0000 mg | ORAL_TABLET | Freq: Once | ORAL | Status: AC
Start: 2023-01-13 — End: 2023-01-13
  Administered 2023-01-13: 4 mg via ORAL
  Filled 2023-01-13: qty 1

## 2023-01-13 NOTE — Discharge Instructions (Addendum)
Your x-ray shows a left lower lobe pneumonia.  Please take both antibiotics as prescribed.  Your COVID test was negative.  As we discussed, please follow-up with your outpatient provider to have your blood test and your x-ray rechecked to ensure that these have resolved as expected.  Please return for any new, worsening, or change in symptoms or other concerns.  You may take Tylenol/ibuprofen per package instructions to help with your symptoms.  You may alternate these as discussed.  You may take Tylenol 650 mg every 6-8 hours and ibuprofen 600 mg every 6-8 hours.  It was a pleasure caring for you today.

## 2023-01-13 NOTE — ED Triage Notes (Signed)
Pt here with a fever, coughing, sinus pressure. Pt states he has not been able to keep food down since Sat. Pt states headache pain.

## 2023-01-13 NOTE — ED Provider Notes (Signed)
[facility] Provider Note    Event Date/Time   First MD Initiated Contact with Patient 01/13/23 [id number]     (approximate)   History   Fever and Nausea   HPI  Alexander Navarro is a 37 y.o. male with a past medical history of hyperlipidemia, obesity, hypertension who presents today for evaluation of fever, congestion, cough, nausea, and vomiting for the past 4 days.  Patient denies any known sick contacts.  He denies any recent travel outside of the [place].  He denies any recent outdoor activity or tick exposure.  He denies any abdominal pain.  He has not taken any antipyretics today.  He reports that he has not had any of the COVID vaccinations.  He denies dysuria or penile discharge.  Patient Active Problem List   Diagnosis Date Noted   Prediabetes 03/18/2021   Mixed hyperlipidemia 03/18/2021   Class 2 severe obesity due to excess calories with serious comorbidity and body mass index (BMI) of 37.0 to 37.9 in adult [facility]) 03/18/2021   Dyshidrotic eczema 05/25/2019   Insomnia 05/25/2019   Essential hypertension 02/10/2018   GERD (gastroesophageal reflux disease) 03/21/2014          Physical Exam   Triage Vital Signs: ED Triage Vitals  Encounter Vitals Group     BP 01/13/23 0750 131/79     Systolic BP Percentile --      Diastolic BP Percentile --      Pulse Rate 01/13/23 0750 (!) 124     Resp 01/13/23 0750 18     Temp 01/13/23 0750 (!) 101.1 F (38.4 C)     Temp Source 01/13/23 0750 Oral     SpO2 01/13/23 0750 96 %     Weight 01/13/23 0751 257 lb 4.4 oz (116.7 kg)     Height 01/13/23 0751 6\' 2"  (1.88 m)     Head Circumference --      Peak Flow --      Pain Score 01/13/23 0751 0     Pain Loc --      Pain Education --      Exclude from Growth Chart --     Most recent vital signs: Vitals:   01/13/23 0750 01/13/23 0919  BP: 131/79 128/70  Pulse: (!) 124 (!) 105  Resp: 18 18  Temp: (!) 101.1 F (38.4 C) 99.5 F (37.5 C)  SpO2: 96% 97%     Physical Exam Vitals and nursing note reviewed.  Constitutional:      General: Awake and alert. No acute distress.    Appearance: Normal appearance. The patient is obese.  HENT:     Head: Normocephalic and atraumatic.     Mouth: Mucous membranes are moist.  Eyes:     General: PERRL. Normal EOMs        Right eye: No discharge.        Left eye: No discharge.     Conjunctiva/sclera: Conjunctivae normal.  Cardiovascular:     Rate and Rhythm: Normal rate and regular rhythm.     Pulses: Normal pulses.  Pulmonary:     Effort: Pulmonary effort is normal. No respiratory distress.  Able to speak easily in complete sentences.  No accessory muscle use.    Breath sounds: Rhonchi in left lower lobe Abdominal:     Abdomen is soft. There is no abdominal tenderness. No rebound or guarding. No distention. Musculoskeletal:        General: No swelling. Normal range of  motion.     Cervical back: Normal range of motion and neck supple.  No nuchal rigidity Skin:    General: Skin is warm and dry.     Capillary Refill: Capillary refill takes less than 2 seconds.     Findings: No rash.  Neurological:     Mental Status: The patient is awake and alert.      ED Results / Procedures / Treatments   Labs (all labs ordered are listed, but only abnormal results are displayed) Labs Reviewed  BASIC METABOLIC PANEL - Abnormal; Notable for the following components:      Result Value   Sodium 132 (*)    Glucose, Bld 119 (*)    All other components within normal limits  CBC WITH DIFFERENTIAL/PLATELET - Abnormal; Notable for the following components:   WBC 19.4 (*)    RBC 5.93 (*)    Platelets 147 (*)    Neutro Abs 17.2 (*)    Monocytes Absolute 1.3 (*)    Abs Immature Granulocytes 0.13 (*)    All other components within normal limits  SARS CORONAVIRUS 2 BY RT PCR     EKG     RADIOLOGY I independently reviewed and interpreted imaging and agree with radiologists  findings.     PROCEDURES:  Critical Care performed:   Procedures   MEDICATIONS ORDERED IN ED: Medications  acetaminophen (TYLENOL) tablet 650 mg (650 mg Oral Given 01/13/23 0810)  ibuprofen (ADVIL) tablet 600 mg (600 mg Oral Given 01/13/23 0810)  ondansetron (ZOFRAN-ODT) disintegrating tablet 4 mg (4 mg Oral Given 01/13/23 5188)     IMPRESSION / MDM / ASSESSMENT AND PLAN / ED COURSE  I reviewed the triage vital signs and the nursing notes.   Differential diagnosis includes, but is not limited to, COVID, pneumonia, bronchitis, other URI, gastroenteritis.  Patient presents to the emergency department febrile to 101.1 F and tachycardic to 124.  He has normal oxygen saturation on room air and is normotensive.  He is able to speak easily in complete sentences.  Basic labs obtained as well as a COVID swab and a chest x-ray for evaluation of pneumonia.  He was treated symptomatically with Zofran, Tylenol, and ibuprofen.  He has no abdominal tenderness on exam, or complaints of abdominal pain.  He has no nuchal rigidity, altered mental status/encephalopathy to suggest meningitis.  Labs are revealing for leukocytosis to 19.  COVID is negative.  Chest x-ray reveals a left lower lobe pneumonia which is consistent with his fever, cough, and leukocytosis.  He has a normal oxygen saturation on room air, I do not feel that he requires admission at this time.  He has not had any recent hospitalizations, will treat as a community-acquired pneumonia.  He was started on antibiotics.  He was given a work note.  We discussed return precautions and the importance of close outpatient follow-up.  He also recommend that he have his chest x-ray and blood work retested in 2-4 weeks to ensure that they have normalized.  Patient understands and agrees with plan.  He was discharged in stable condition with his family member.   Patient's presentation is most consistent with acute presentation with potential threat  to life or bodily function.  FINAL CLINICAL IMPRESSION(S) / ED DIAGNOSES   Final diagnoses:  Pneumonia of left lower lobe due to infectious organism     Rx / DC Orders   ED Discharge Orders          Ordered  azithromycin (ZITHROMAX Z-PAK) 250 MG tablet        01/13/23 0911    cefpodoxime (VANTIN) 200 MG tablet  2 times daily        01/13/23 0911             Note:  This document was prepared using Dragon voice recognition software and may include unintentional dictation errors.   Jackelyn Hoehn, PA-C 01/13/23 1402    Jene Every, MD 01/15/23 [phone]

## 2024-01-21 ENCOUNTER — Ambulatory Visit (INDEPENDENT_AMBULATORY_CARE_PROVIDER_SITE_OTHER): Admitting: Internal Medicine

## 2024-01-21 ENCOUNTER — Encounter: Payer: Self-pay | Admitting: Internal Medicine

## 2024-01-21 VITALS — BP 150/90 | Ht 74.0 in | Wt 286.8 lb

## 2024-01-21 DIAGNOSIS — I10 Essential (primary) hypertension: Secondary | ICD-10-CM

## 2024-01-21 DIAGNOSIS — E782 Mixed hyperlipidemia: Secondary | ICD-10-CM | POA: Diagnosis not present

## 2024-01-21 DIAGNOSIS — Z0001 Encounter for general adult medical examination with abnormal findings: Secondary | ICD-10-CM | POA: Diagnosis not present

## 2024-01-21 DIAGNOSIS — Z6836 Body mass index (BMI) 36.0-36.9, adult: Secondary | ICD-10-CM

## 2024-01-21 DIAGNOSIS — E66812 Obesity, class 2: Secondary | ICD-10-CM | POA: Diagnosis not present

## 2024-01-21 DIAGNOSIS — R7303 Prediabetes: Secondary | ICD-10-CM

## 2024-01-21 MED ORDER — AMLODIPINE BESYLATE 10 MG PO TABS: ORAL_TABLET | ORAL | 1 refills | Status: AC

## 2024-01-21 MED ORDER — LOSARTAN POTASSIUM 50 MG PO TABS: ORAL_TABLET | ORAL | 1 refills | Status: AC

## 2024-01-21 NOTE — Patient Instructions (Signed)
 Health Maintenance, Male  Adopting a healthy lifestyle and getting preventive care are important in promoting health and wellness. Ask your health care provider about:  The right schedule for you to have regular tests and exams.  Things you can do on your own to prevent diseases and keep yourself healthy.  What should I know about diet, weight, and exercise?  Eat a healthy diet    Eat a diet that includes plenty of vegetables, fruits, low-fat dairy products, and lean protein.  Do not eat a lot of foods that are high in solid fats, added sugars, or sodium.  Maintain a healthy weight  Body mass index (BMI) is a measurement that can be used to identify possible weight problems. It estimates body fat based on height and weight. Your health care provider can help determine your BMI and help you achieve or maintain a healthy weight.  Get regular exercise  Get regular exercise. This is one of the most important things you can do for your health. Most adults should:  Exercise for at least 150 minutes each week. The exercise should increase your heart rate and make you sweat (moderate-intensity exercise).  Do strengthening exercises at least twice a week. This is in addition to the moderate-intensity exercise.  Spend less time sitting. Even light physical activity can be beneficial.  Watch cholesterol and blood lipids  Have your blood tested for lipids and cholesterol at 37 years of age, then have this test every 5 years.  You may need to have your cholesterol levels checked more often if:  Your lipid or cholesterol levels are high.  You are older than 37 years of age.  You are at high risk for heart disease.  What should I know about cancer screening?  Many types of cancers can be detected early and may often be prevented. Depending on your health history and family history, you may need to have cancer screening at various ages. This may include screening for:  Colorectal cancer.  Prostate cancer.  Skin cancer.  Lung  cancer.  What should I know about heart disease, diabetes, and high blood pressure?  Blood pressure and heart disease  High blood pressure causes heart disease and increases the risk of stroke. This is more likely to develop in people who have high blood pressure readings or are overweight.  Talk with your health care provider about your target blood pressure readings.  Have your blood pressure checked:  Every 3-5 years if you are 9-95 years of age.  Every year if you are 85 years old or older.  If you are between the ages of 29 and 29 and are a current or former smoker, ask your health care provider if you should have a one-time screening for abdominal aortic aneurysm (AAA).  Diabetes  Have regular diabetes screenings. This checks your fasting blood sugar level. Have the screening done:  Once every three years after age 23 if you are at a normal weight and have a low risk for diabetes.  More often and at a younger age if you are overweight or have a high risk for diabetes.  What should I know about preventing infection?  Hepatitis B  If you have a higher risk for hepatitis B, you should be screened for this virus. Talk with your health care provider to find out if you are at risk for hepatitis B infection.  Hepatitis C  Blood testing is recommended for:  Everyone born from [birth year] through 1964.  Anyone  with known risk factors for hepatitis C.  Sexually transmitted infections (STIs)  You should be screened each year for STIs, including gonorrhea and chlamydia, if:  You are sexually active and are younger than 37 years of age.  You are older than 37 years of age and your health care provider tells you that you are at risk for this type of infection.  Your sexual activity has changed since you were last screened, and you are at increased risk for chlamydia or gonorrhea. Ask your health care provider if you are at risk.  Ask your health care provider about whether you are at high risk for HIV. Your health care provider  may recommend a prescription medicine to help prevent HIV infection. If you choose to take medicine to prevent HIV, you should first get tested for HIV. You should then be tested every 3 months for as long as you are taking the medicine.  Follow these instructions at home:  Alcohol use  Do not drink alcohol if your health care provider tells you not to drink.  If you drink alcohol:  Limit how much you have to 0-2 drinks a day.  Know how much alcohol is in your drink. In the [place] one drink equals one 12 oz bottle of beer (355 mL), one 5 oz glass of wine (148 mL), or one 1 oz glass of hard liquor (44 mL).  Lifestyle  Do not use any products that contain nicotine or tobacco. These products include cigarettes, chewing tobacco, and vaping devices, such as e-cigarettes. If you need help quitting, ask your health care provider.  Do not use street drugs.  Do not share needles.  Ask your health care provider for help if you need support or information about quitting drugs.  General instructions  Schedule regular health, dental, and eye exams.  Stay current with your vaccines.  Tell your health care provider if:  You often feel depressed.  You have ever been abused or do not feel safe at home.  Summary  Adopting a healthy lifestyle and getting preventive care are important in promoting health and wellness.  Follow your health care provider's instructions about healthy diet, exercising, and getting tested or screened for diseases.  Follow your health care provider's instructions on monitoring your cholesterol and blood pressure.  This information is not intended to replace advice given to you by your health care provider. Make sure you discuss any questions you have with your health care provider.  Document Revised: 10/22/2020 Document Reviewed: 10/22/2020  Elsevier Patient Education  2024 [employer].

## 2024-01-21 NOTE — Assessment & Plan Note (Signed)
 Elevated off medication Will refill losartan  50 mg and amlodipine  10 mg daily Reinforced DASH diet and exercise for weight loss C-Met today

## 2024-01-21 NOTE — Assessment & Plan Note (Signed)
 Encourage diet and exercise for weight loss

## 2024-01-21 NOTE — Progress Notes (Signed)
 Subjective:    Patient ID: Alexander Navarro, male    DOB: [birth year], 38 y.o.   MRN: [id number]  HPI  Patient presents the clinic today for his annual exam. Of note, his BP today is 152/84.  He admits that he has been out of his losartan  50 mg and amlodipine  10 mg daily.  Flu: 03/2017 Tetanus: 10/2014 COVID: never Dentist: as needed  Diet: He does eat meat. He consumes some fruits and veggies. He does eat some fried foods. He drinks mostly Dt. Mt. Dew, some water. Exercise: None  Review of Systems      Past Medical History:  Diagnosis Date   Fatty liver    Frequent headaches    GERD (gastroesophageal reflux disease)    Obesity     Current Outpatient Medications  Medication Sig Dispense Refill   amLODipine  (NORVASC ) 10 MG tablet TAKE 1 TABLET(10 MG) BY MOUTH DAILY 90 tablet 3   azithromycin  (ZITHROMAX  Z-PAK) 250 MG tablet Take 2 tablets on day 1, and take 1 tablet for days 2-5 6 each 0   losartan  (COZAAR ) 50 MG tablet TAKE 1 TABLET(50 MG) BY MOUTH DAILY 90 tablet 3   traZODone  (DESYREL ) 50 MG tablet TAKE 1/2 TO 1 TABLET(25 TO 50 MG) BY MOUTH AT BEDTIME AS NEEDED FOR SLEEP 30 tablet 0   No current facility-administered medications for this visit.    Allergies  Allergen Reactions   Shrimp [Shellfish Allergy] Nausea And Vomiting    Family History  Problem Relation Age of Onset   Hypertension Mother    Thyroid  disease Mother    Heart disease Father    Stomach cancer Maternal Grandmother    Prostate cancer Maternal Grandfather    Throat cancer Paternal Aunt    Colon cancer Neg Hx    Colon polyps Neg Hx    Kidney disease Neg Hx    Esophageal cancer Neg Hx    Gallbladder disease Neg Hx     Social History   Socioeconomic History   Marital status: Married    Spouse name: Not on file   Number of children: 0   Years of education: Not on file   Highest education level: Not on file  Occupational History   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  Tobacco Use   Smoking  status: Every Day    Current packs/day: 0.00    Average packs/day: 1 pack/day for 8.0 years (8.0 ttl pk-yrs)    Types: Cigarettes    Start date: 06/16/2004    Last attempt to quit: 06/16/2012    Years since quitting: 11.6   Smokeless tobacco: Never  Substance and Sexual Activity   Alcohol use: Yes    Alcohol/week: 1.0 standard drink of alcohol    Types: 1 Cans of beer per week    Comment: occasional   Drug use: No   Sexual activity: Yes  Other Topics Concern   Not on file  Social History Narrative   Not on file   Social Drivers of Health   Financial Resource Strain: Not on file  Food Insecurity: Not on file  Transportation Needs: Not on file  Physical Activity: Not on file  Stress: Not on file  Social Connections: Not on file  Intimate Partner Violence: Not on file     Constitutional: Denies fever, malaise, fatigue, headache or abrupt weight changes.  HEENT: Denies eye pain, eye redness, ear pain, ringing in the ears, wax buildup, runny nose, nasal congestion, bloody nose, or sore throat. Respiratory:  Denies difficulty breathing, shortness of breath, cough or sputum production.   Cardiovascular: Denies chest pain, chest tightness, palpitations or swelling in the hands or feet.  Gastrointestinal: Denies abdominal pain, bloating, constipation, diarrhea or blood in the stool.  GU: Denies urgency, frequency, pain with urination, burning sensation, blood in urine, odor or discharge. Musculoskeletal: Denies decrease in range of motion, difficulty with gait, muscle pain or joint pain and swelling.  Skin: Denies redness, rashes, lesions or ulcercations.  Neurological: Patient reports insomnia.  Denies dizziness, difficulty with memory, difficulty with speech or problems with balance and coordination.  Psych: Patient reports anxiety.  Denies depression, SI/HI.  No other specific complaints in a complete review of systems (except as listed in HPI above).  Objective:   Physical  Exam  BP (!) 152/84 (BP Location: Left Arm, Patient Position: Sitting, Cuff Size: Large)   Ht 6' 2 (1.88 m)   Wt 286 lb 12.8 oz (130.1 kg)   BMI 36.82 kg/m    Wt Readings from Last 3 Encounters:  01/13/23 257 lb 4.4 oz (116.7 kg)  11/29/21 257 lb 3.2 oz (116.7 kg)  10/24/21 255 lb (115.7 kg)    General: Appears his stated age, obese, in NAD. Skin: Warm, dry and intact. HEENT: Head: normal shape and size; Eyes: sclera white, no icterus, conjunctiva pink, PERRLA and EOMs intact; Neck:  Neck supple, trachea midline. No masses, lumps or thyromegaly present.  Cardiovascular: Normal rate and rhythm. S1,S2 noted.  No murmur, rubs or gallops noted. No JVD or BLE edema.  Pulmonary/Chest: Normal effort and positive vesicular breath sounds. No respiratory distress. No wheezes, rales or ronchi noted.  Abdomen: Soft and nontender. Normal bowel sounds. No distention or masses noted. Liver, spleen and kidneys non palpable. Musculoskeletal: Strength 5/5 BUE/BLE no difficulty with gait.  Neurological: Alert and oriented. Cranial nerves II-XII grossly intact. Coordination normal.  Psychiatric: Mood and affect normal. Behavior is normal. Judgment and thought content normal.     BMET    Component Value Date/Time   NA 132 (L) 01/13/2023 0812   K 3.9 01/13/2023 0812   CL 98 01/13/2023 0812   CO2 23 01/13/2023 0812   GLUCOSE 119 (H) 01/13/2023 0812   BUN 17 01/13/2023 0812   CREATININE 1.07 01/13/2023 0812   CREATININE 0.80 03/18/2021 0915   CALCIUM 9.2 01/13/2023 0812   GFRNONAA >60 01/13/2023 0812   GFRAA >90 10/12/2013 0114    Lipid Panel     Component Value Date/Time   CHOL 188 03/18/2021 0915   TRIG 251 (H) 03/18/2021 0915   HDL 35 (L) 03/18/2021 0915   CHOLHDL 5.4 (H) 03/18/2021 0915   VLDL 32.8 06/27/2020 1547   LDLCALC 117 (H) 03/18/2021 0915    CBC    Component Value Date/Time   WBC 19.4 (H) 01/13/2023 0812   RBC 5.93 (H) 01/13/2023 0812   HGB 16.6 01/13/2023 0812    HCT 48.0 01/13/2023 0812   PLT 147 (L) 01/13/2023 0812   MCV 80.9 01/13/2023 0812   MCH 28.0 01/13/2023 0812   MCHC 34.6 01/13/2023 0812   RDW 12.6 01/13/2023 0812   LYMPHSABS 0.7 01/13/2023 0812   MONOABS 1.3 (H) 01/13/2023 0812   EOSABS 0.0 01/13/2023 0812   BASOSABS 0.1 01/13/2023 0812    Hgb A1C Lab Results  Component Value Date   HGBA1C 5.2 03/18/2021            Assessment & Plan:   Preventative Health Maintenance:  Encouraged him to get a  flu shot in the fall Tetanus UTD He declines COVID-vaccine Encouraged him to consume a balanced diet and exercise regimen Advised him to see an eye doctor and dentist annually We will check CBC, c-Met, lipid, A1c today  RTC in 2 weeks follow-up HTN, 6 months, follow-up chronic condition Angeline Laura, NP

## 2024-01-22 ENCOUNTER — Ambulatory Visit: Payer: Self-pay | Admitting: Internal Medicine

## 2024-01-22 LAB — COMPREHENSIVE METABOLIC PANEL WITH GFR
AG Ratio: 2.8 (calc) — ABNORMAL HIGH (ref 1.0–2.5)
ALT: 28 U/L (ref 9–46)
AST: 21 U/L (ref 10–40)
Albumin: 4.7 g/dL (ref 3.6–5.1)
Alkaline phosphatase (APISO): 60 U/L (ref 36–130)
BUN: 15 mg/dL (ref 7–25)
CO2: 27 mmol/L (ref 20–32)
Calcium: 9.2 mg/dL (ref 8.6–10.3)
Chloride: 102 mmol/L (ref 98–110)
Creat: 0.77 mg/dL (ref 0.60–1.26)
Globulin: 1.7 g/dL — ABNORMAL LOW (ref 1.9–3.7)
Glucose, Bld: 86 mg/dL (ref 65–99)
Potassium: 4.7 mmol/L (ref 3.5–5.3)
Sodium: 138 mmol/L (ref 135–146)
Total Bilirubin: 0.3 mg/dL (ref 0.2–1.2)
Total Protein: 6.4 g/dL (ref 6.1–8.1)
eGFR: 119 mL/min/1.73m2 (ref >=60)

## 2024-01-22 LAB — LIPID PANEL
Cholesterol: 158 mg/dL (ref <200)
HDL: 29 mg/dL — ABNORMAL LOW (ref >=40)
LDL Cholesterol (Calc): 92 mg/dL
Non-HDL Cholesterol (Calc): 129 mg/dL (ref <130)
Total CHOL/HDL Ratio: 5.4 (calc) — ABNORMAL HIGH (ref <5.0)
Triglycerides: 307 mg/dL — ABNORMAL HIGH (ref <150)

## 2024-01-22 LAB — CBC
HCT: 47.2 % (ref 38.5–50.0)
Hemoglobin: 15.7 g/dL (ref 13.2–17.1)
MCH: 27.4 pg (ref 27.0–33.0)
MCHC: 33.3 g/dL (ref 32.0–36.0)
MCV: 82.5 fL (ref 80.0–100.0)
MPV: 12.8 fL — ABNORMAL HIGH (ref 7.5–12.5)
Platelets: 185 Thousand/uL (ref 140–400)
RBC: 5.72 Million/uL (ref 4.20–5.80)
RDW: 13.8 % (ref 11.0–15.0)
WBC: 8.9 Thousand/uL (ref 3.8–10.8)

## 2024-01-22 LAB — HEMOGLOBIN A1C
Hgb A1c MFr Bld: 5.6 % (ref <5.7)
Mean Plasma Glucose: 114 mg/dL
eAG (mmol/L): 6.3 mmol/L

## 2024-02-03 ENCOUNTER — Ambulatory Visit: Admitting: Internal Medicine

## 2024-02-03 NOTE — Progress Notes (Deleted)
 Subjective:    Patient ID: Alexander Navarro, male    DOB: 05-19-1986, 38 y.o.   MRN: [id number]  HPI  Patient presents the clinic today for 2-week follow-up of HTN.  At his last visit, he was restarted on his losartan  and amlodipine  that he had been out of.  He has been taking the medication as prescribed.  His BP today is.  Review of Systems      Past Medical History:  Diagnosis Date   Fatty liver    Frequent headaches    GERD (gastroesophageal reflux disease)    Obesity     Current Outpatient Medications  Medication Sig Dispense Refill   amLODipine  (NORVASC ) 10 MG tablet TAKE 1 TABLET(10 MG) BY MOUTH DAILY 90 tablet 1   losartan  (COZAAR ) 50 MG tablet TAKE 1 TABLET(50 MG) BY MOUTH DAILY 90 tablet 1   No current facility-administered medications for this visit.    Allergies  Allergen Reactions   Shrimp [Shellfish Allergy] Nausea And Vomiting    Family History  Problem Relation Age of Onset   Hypertension Mother    Thyroid  disease Mother    Heart disease Father    Stomach cancer Maternal Grandmother    Prostate cancer Maternal Grandfather    Throat cancer Paternal Aunt    Colon cancer Neg Hx    Colon polyps Neg Hx    Kidney disease Neg Hx    Esophageal cancer Neg Hx    Gallbladder disease Neg Hx     Social History   Socioeconomic History   Marital status: Married    Spouse name: Not on file   Number of children: 0   Years of education: Not on file   Highest education level: Not on file  Occupational History   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  Tobacco Use   Smoking status: Every Day    Current packs/day: 0.00    Average packs/day: 1 pack/day for 8.0 years (8.0 ttl pk-yrs)    Types: Cigarettes    Start date: 06/16/2004    Last attempt to quit: 06/16/2012    Years since quitting: 11.6   Smokeless tobacco: Never  Substance and Sexual Activity   Alcohol use: Yes    Alcohol/week: 1.0 standard drink of alcohol    Types: 1 Cans of beer per week    Comment:  occasional   Drug use: No   Sexual activity: Yes  Other Topics Concern   Not on file  Social History Narrative   Not on file   Social Drivers of Health   Financial Resource Strain: Not on file  Food Insecurity: Not on file  Transportation Needs: Not on file  Physical Activity: Not on file  Stress: Not on file  Social Connections: Not on file  Intimate Partner Violence: Not on file     Constitutional: Denies fever, malaise, fatigue, headache or abrupt weight changes.  HEENT: Denies eye pain, eye redness, ear pain, ringing in the ears, wax buildup, runny nose, nasal congestion, bloody nose, or sore throat. Respiratory: Denies difficulty breathing, shortness of breath, cough or sputum production.   Cardiovascular: Denies chest pain, chest tightness, palpitations or swelling in the hands or feet.  Gastrointestinal: Denies abdominal pain, bloating, constipation, diarrhea or blood in the stool.  GU: Denies urgency, frequency, pain with urination, burning sensation, blood in urine, odor or discharge. Musculoskeletal: Denies decrease in range of motion, difficulty with gait, muscle pain or joint pain and swelling.  Skin: Denies redness, rashes, lesions or  ulcercations.  Neurological: Patient reports insomnia.  Denies dizziness, difficulty with memory, difficulty with speech or problems with balance and coordination.  Psych: Patient reports anxiety.  Denies depression, SI/HI.  No other specific complaints in a complete review of systems (except as listed in HPI above).  Objective:   Physical Exam  There were no vitals taken for this visit.   Wt Readings from Last 3 Encounters:  01/21/24 286 lb 12.8 oz (130.1 kg)  01/13/23 257 lb 4.4 oz (116.7 kg)  11/29/21 257 lb 3.2 oz (116.7 kg)    General: Appears his stated age, obese, in NAD. Skin: Warm, dry and intact. HEENT: Head: normal shape and size; Eyes: sclera white, no icterus, conjunctiva pink, PERRLA and EOMs intact; Neck:  Neck  supple, trachea midline. No masses, lumps or thyromegaly present.  Cardiovascular: Normal rate and rhythm. S1,S2 noted.  No murmur, rubs or gallops noted. No JVD or BLE edema.  Pulmonary/Chest: Normal effort and positive vesicular breath sounds. No respiratory distress. No wheezes, rales or ronchi noted.  Abdomen: Soft and nontender. Normal bowel sounds. No distention or masses noted. Liver, spleen and kidneys non palpable. Musculoskeletal: Strength 5/5 BUE/BLE no difficulty with gait.  Neurological: Alert and oriented. Cranial nerves II-XII grossly intact. Coordination normal.  Psychiatric: Mood and affect normal. Behavior is normal. Judgment and thought content normal.     BMET    Component Value Date/Time   NA 138 01/21/2024 1432   K 4.7 01/21/2024 1432   CL 102 01/21/2024 1432   CO2 27 01/21/2024 1432   GLUCOSE 86 01/21/2024 1432   BUN 15 01/21/2024 1432   CREATININE 0.77 01/21/2024 1432   CALCIUM 9.2 01/21/2024 1432   GFRNONAA >60 01/13/2023 0812   GFRAA >90 10/12/2013 0114    Lipid Panel     Component Value Date/Time   CHOL 158 01/21/2024 1432   TRIG 307 (H) 01/21/2024 1432   HDL 29 (L) 01/21/2024 1432   CHOLHDL 5.4 (H) 01/21/2024 1432   VLDL 32.8 06/27/2020 1547   LDLCALC 92 01/21/2024 1432    CBC    Component Value Date/Time   WBC 8.9 01/21/2024 1432   RBC 5.72 01/21/2024 1432   HGB 15.7 01/21/2024 1432   HCT 47.2 01/21/2024 1432   PLT 185 01/21/2024 1432   MCV 82.5 01/21/2024 1432   MCH 27.4 01/21/2024 1432   MCHC 33.3 01/21/2024 1432   RDW 13.8 01/21/2024 1432   LYMPHSABS 0.7 01/13/2023 0812   MONOABS 1.3 (H) 01/13/2023 0812   EOSABS 0.0 01/13/2023 0812   BASOSABS 0.1 01/13/2023 0812    Hgb A1C Lab Results  Component Value Date   HGBA1C 5.6 01/21/2024            Assessment & Plan:    RTC in 6 months, follow-up chronic condition Angeline Laura, NP
